# Patient Record
Sex: Female | Born: 1955 | Hispanic: Yes | Marital: Married | Smoking: Never smoker
Health system: Southern US, Community
[De-identification: ages and names within clinical notes are randomized; demographics above are authoritative.]

## PROBLEM LIST (undated history)

## (undated) DIAGNOSIS — E785 Hyperlipidemia, unspecified: Secondary | ICD-10-CM

## (undated) DIAGNOSIS — I10 Essential (primary) hypertension: Secondary | ICD-10-CM

## (undated) DIAGNOSIS — E079 Disorder of thyroid, unspecified: Secondary | ICD-10-CM

## (undated) DIAGNOSIS — M1990 Unspecified osteoarthritis, unspecified site: Secondary | ICD-10-CM

## (undated) DIAGNOSIS — E039 Hypothyroidism, unspecified: Secondary | ICD-10-CM

## (undated) HISTORY — DX: Essential (primary) hypertension: I10

## (undated) HISTORY — DX: Hypothyroidism, unspecified: E03.9

## (undated) HISTORY — DX: Disorder of thyroid, unspecified: E07.9

## (undated) HISTORY — PX: CHOLECYSTECTOMY: SHX55

## (undated) HISTORY — PX: UTERINE FIBROID SURGERY: SHX826

## (undated) HISTORY — DX: Unspecified osteoarthritis, unspecified site: M19.90

---

## 2021-01-25 ENCOUNTER — Emergency Department (HOSPITAL_COMMUNITY): Payer: 59

## 2021-01-25 ENCOUNTER — Other Ambulatory Visit: Payer: Self-pay

## 2021-01-25 ENCOUNTER — Observation Stay (HOSPITAL_COMMUNITY)
Admission: EM | Admit: 2021-01-25 | Discharge: 2021-01-27 | Disposition: A | Discharge status: Home / Self Care | Payer: 59 | Attending: Internal Medicine | Admitting: Internal Medicine

## 2021-01-25 ENCOUNTER — Encounter (HOSPITAL_COMMUNITY): Payer: Self-pay

## 2021-01-25 DIAGNOSIS — Z20822 Contact with and (suspected) exposure to covid-19: Secondary | ICD-10-CM | POA: Insufficient documentation

## 2021-01-25 DIAGNOSIS — R079 Chest pain, unspecified: Secondary | ICD-10-CM

## 2021-01-25 DIAGNOSIS — E039 Hypothyroidism, unspecified: Secondary | ICD-10-CM | POA: Insufficient documentation

## 2021-01-25 DIAGNOSIS — R0789 Other chest pain: Principal | ICD-10-CM | POA: Insufficient documentation

## 2021-01-25 DIAGNOSIS — Z79899 Other long term (current) drug therapy: Secondary | ICD-10-CM | POA: Diagnosis not present

## 2021-01-25 DIAGNOSIS — R55 Syncope and collapse: Secondary | ICD-10-CM | POA: Diagnosis not present

## 2021-01-25 HISTORY — DX: Hyperlipidemia, unspecified: E78.5

## 2021-01-25 HISTORY — DX: Chest pain, unspecified: R07.9

## 2021-01-25 LAB — CBC
HCT: 46.7 % — ABNORMAL HIGH (ref 36.0–46.0)
Hemoglobin: 15.6 g/dL — ABNORMAL HIGH (ref 12.0–15.0)
MCH: 27.6 pg (ref 26.0–34.0)
MCHC: 33.4 g/dL (ref 30.0–36.0)
MCV: 82.7 fL (ref 80.0–100.0)
Platelets: 307 10*3/uL (ref 150–400)
RBC: 5.65 MIL/uL — ABNORMAL HIGH (ref 3.87–5.11)
RDW: 12.8 % (ref 11.5–15.5)
WBC: 17.2 10*3/uL — ABNORMAL HIGH (ref 4.0–10.5)
nRBC: 0 % (ref 0.0–0.2)

## 2021-01-25 LAB — TROPONIN I (HIGH SENSITIVITY)
Troponin I (High Sensitivity): 3 ng/L (ref <18)
Troponin I (High Sensitivity): 3 ng/L (ref <18)

## 2021-01-25 LAB — RESP PANEL BY RT-PCR (FLU A&B, COVID) ARPGX2
Influenza A by PCR: NEGATIVE
Influenza B by PCR: NEGATIVE
SARS Coronavirus 2 by RT PCR: NEGATIVE

## 2021-01-25 LAB — BASIC METABOLIC PANEL
Anion gap: 13 (ref 5–15)
BUN: 25 mg/dL — ABNORMAL HIGH (ref 8–23)
CO2: 25 mmol/L (ref 22–32)
Calcium: 9.6 mg/dL (ref 8.9–10.3)
Chloride: 92 mmol/L — ABNORMAL LOW (ref 98–111)
Creatinine, Ser: 0.65 mg/dL (ref 0.44–1.00)
GFR, Estimated: >60 mL/min (ref >60)
Glucose, Bld: 84 mg/dL (ref 70–99)
Potassium: 4.3 mmol/L (ref 3.5–5.1)
Sodium: 130 mmol/L — ABNORMAL LOW (ref 135–145)

## 2021-01-25 MED ORDER — ENOXAPARIN SODIUM 40 MG/0.4ML IJ SOSY: 40.0000 mg | PREFILLED_SYRINGE | INTRAMUSCULAR | Status: DC

## 2021-01-25 MED ORDER — METHYLPREDNISOLONE 4 MG PO TBPK: 4.0000 mg | ORAL_TABLET | ORAL | Status: DC

## 2021-01-25 MED ORDER — PRAVASTATIN SODIUM 20 MG PO TABS
10.0000 mg | ORAL_TABLET | Freq: Every day | ORAL | Status: DC
  Administered 2021-01-26: 10 mg via ORAL
  Filled 2021-01-25: qty 1

## 2021-01-25 MED ORDER — METHYLPREDNISOLONE 2 MG PO TABS: 4.0000 mg | ORAL_TABLET | Freq: Every day | ORAL | Status: DC

## 2021-01-25 MED ORDER — ENOXAPARIN SODIUM 40 MG/0.4ML IJ SOSY
40.0000 mg | PREFILLED_SYRINGE | INTRAMUSCULAR | Status: DC
  Administered 2021-01-26 (×2): 40 mg via SUBCUTANEOUS
  Filled 2021-01-25 (×2): qty 0.4

## 2021-01-25 MED ORDER — METHYLPREDNISOLONE 2 MG PO TABS
4.0000 mg | ORAL_TABLET | Freq: Two times a day (BID) | ORAL | Status: DC
  Filled 2021-01-25: qty 2

## 2021-01-25 MED ORDER — ASPIRIN 81 MG PO CHEW
324.0000 mg | CHEWABLE_TABLET | Freq: Once | ORAL | Status: AC
  Administered 2021-01-25: 324 mg via ORAL
  Filled 2021-01-25: qty 4

## 2021-01-25 MED ORDER — LEVOTHYROXINE SODIUM 100 MCG PO TABS
100.0000 ug | ORAL_TABLET | Freq: Every morning | ORAL | Status: DC
  Administered 2021-01-26 – 2021-01-27 (×2): 100 ug via ORAL
  Filled 2021-01-25 (×2): qty 1

## 2021-01-25 MED ORDER — SODIUM CHLORIDE 0.9 % IV BOLUS
1000.0000 mL | Freq: Once | INTRAVENOUS | Status: AC
  Administered 2021-01-25: 1000 mL via INTRAVENOUS

## 2021-01-25 MED ORDER — ACETAMINOPHEN 325 MG PO TABS: 650.0000 mg | ORAL_TABLET | ORAL | Status: DC | PRN

## 2021-01-25 MED ORDER — IOHEXOL 350 MG/ML SOLN
100.0000 mL | Freq: Once | INTRAVENOUS | Status: AC | PRN
  Administered 2021-01-25: 100 mL via INTRAVENOUS

## 2021-01-25 MED ORDER — METHYLPREDNISOLONE 4 MG PO TBPK: ORAL_TABLET | Freq: Two times a day (BID) | ORAL | Status: AC

## 2021-01-25 MED ORDER — ONDANSETRON HCL 4 MG/2ML IJ SOLN: 4.0000 mg | Freq: Four times a day (QID) | INTRAMUSCULAR | Status: DC | PRN

## 2021-01-25 MED ORDER — CYCLOBENZAPRINE HCL 5 MG PO TABS: 5.0000 mg | ORAL_TABLET | Freq: Every day | ORAL | Status: DC | PRN

## 2021-01-25 MED ORDER — LOSARTAN POTASSIUM 25 MG PO TABS
25.0000 mg | ORAL_TABLET | Freq: Two times a day (BID) | ORAL | Status: DC
  Administered 2021-01-26 – 2021-01-27 (×2): 25 mg via ORAL
  Filled 2021-01-25 (×4): qty 1

## 2021-01-25 MED ORDER — METHYLPREDNISOLONE 4 MG PO TBPK: ORAL_TABLET | Freq: Every day | ORAL | Status: AC

## 2021-01-25 NOTE — ED Provider Notes (Signed)
Emergency Medicine Provider Triage Evaluation Note  Beverly Trevino , a 65 y.o. female  was evaluated in triage.  Pt complains of chest pain.  Patient reports left-sided chest pain radiating into her arm started last yesterday morning, pain has been intermittent, but became more severe this morning.  At 4 AM when she got up to go to the bathroom she had a syncopal episode and fell to the ground, is unsure if she hit her head but does complain of some headache.  Review of Systems  Positive: Chest pain, syncope Negative: Fever  Physical Exam  BP (!) 155/76 (BP Location: Left Arm)   Pulse 77   Temp 97.9 F (36.6 C) (Oral)   Resp 18   Ht 4\' 11"  (1.499 m)   Wt 61.7 kg   SpO2 100%   BMI 27.47 kg/m  Gen:   Awake, no distress, but is somewhat ill-appearing Resp:  Normal effort, heart RRR MSK:   Moves extremities without difficulty  Other:    Medical Decision Making  Medically screening exam initiated at 10:58 AM.  Appropriate orders placed.  David Towson was informed that the remainder of the evaluation will be completed by another provider, this initial triage assessment does not replace that evaluation, and the importance of remaining in the ED until their evaluation is complete.  Chest pain and syncope, will need acute bed for more emergent evaluation.  Notified triage nurse.   Jacqlyn Larsen, PA-C 01/25/21 1109    Fredia Sorrow, MD 01/31/21 [phone]

## 2021-01-25 NOTE — ED Triage Notes (Signed)
Patient reports that she began having chest pain yesterday and at 0400 she began having left arm pain as well and states the pain radiates into the back. Patient also reports that she passed out for a brief second, but did not hit her head.

## 2021-01-25 NOTE — ED Notes (Signed)
Blue top tube sent to lab. 

## 2021-01-25 NOTE — ED Provider Notes (Signed)
[facility] DEPT Provider Note   CSN: [id number] Arrival date & time: 01/25/21  3546     History Chief Complaint  Patient presents with   Chest Pain   Dizziness    Beverly Trevino is a 65 y.o. female with a past medical history significant for thyroid disease who presents to the ED due to intermittent, central chest pain that started yesterday.  Patient states chest pain radiates in between shoulder blades and down left arm.  She describes pain as a pressure-like sensation. Patient denies associated shortness of breath, nausea, vomiting, diaphoresis. Denies dizziness. Patient notes early this morning she woke up to use the bathroom when she lost consciousness.  Unsure whether or not she hit her head.  No history of seizures.  No urinary incontinence or mouth trauma.  She admits to generalized weakness. Denies history of MI or CVA.  Denies tobacco use.  Denies history of blood clots, recent surgeries, recent long immobilizations, and hormonal treatments.  No lower extremity edema.  Last episode of chest pain was 8 AM this morning.  Chest pain is nonexertional or pleuritic. No recent illness.   History obtained from patient and past medical records. Official Patent attorney used during entire encounter.      Past Medical History:  Diagnosis Date   Thyroid disease     There are no problems to display for this patient.   Past Surgical History:  Procedure Laterality Date   CHOLECYSTECTOMY       OB History   No obstetric history on file.     History reviewed. No pertinent family history.  Social History   Tobacco Use   Smoking status: Never   Smokeless tobacco: Never  Vaping Use   Vaping Use: Never used  Substance Use Topics   Alcohol use: Not Currently   Drug use: Never    Home Medications Prior to Admission medications   Not on File    Allergies    Lactose intolerance (gi) and Other  Review of Systems   Review  of Systems  Constitutional:  Negative for chills and fever.  Respiratory:  Negative for shortness of breath.   Cardiovascular:  Positive for chest pain.  Gastrointestinal:  Negative for abdominal pain, nausea and vomiting.  Musculoskeletal:  Positive for back pain.  Neurological:  Positive for weakness (generalized).  All other systems reviewed and are negative.  Physical Exam Updated Vital Signs BP 129/65   Pulse 73   Temp 97.9 F (36.6 C) (Oral)   Resp 16   Ht 4\' 11"  (1.499 m)   Wt 61.7 kg   SpO2 99%   BMI 27.47 kg/m   Physical Exam Vitals and nursing note reviewed.  Constitutional:      General: She is not in acute distress.    Appearance: She is not ill-appearing.  HENT:     Head: Normocephalic.  Eyes:     Pupils: Pupils are equal, round, and reactive to light.  Cardiovascular:     Rate and Rhythm: Normal rate and regular rhythm.     Pulses: Normal pulses.     Heart sounds: Normal heart sounds. No murmur heard.   No friction rub. No gallop.  Pulmonary:     Effort: Pulmonary effort is normal.     Breath sounds: Normal breath sounds.  Abdominal:     General: Abdomen is flat. There is no distension.     Palpations: Abdomen is soft.     Tenderness: There  is no abdominal tenderness. There is no guarding or rebound.  Musculoskeletal:        General: Normal range of motion.     Cervical back: Neck supple.  Skin:    General: Skin is warm and dry.  Neurological:     General: No focal deficit present.     Mental Status: She is alert.     Comments: Speech is clear, able to follow commands CN III-XII intact Normal strength in upper and lower extremities bilaterally including dorsiflexion and plantar flexion, strong and equal grip strength Sensation grossly intact throughout Moves extremities without ataxia, coordination intact No pronator drift   Psychiatric:        Mood and Affect: Mood normal.        Behavior: Behavior normal.    ED Results / Procedures /  Treatments   Labs (all labs ordered are listed, but only abnormal results are displayed) Labs Reviewed  BASIC METABOLIC PANEL - Abnormal; Notable for the following components:      Result Value   Sodium 130 (*)    Chloride 92 (*)    BUN 25 (*)    All other components within normal limits  CBC - Abnormal; Notable for the following components:   WBC 17.2 (*)    RBC 5.65 (*)    Hemoglobin 15.6 (*)    HCT 46.7 (*)    All other components within normal limits  RESP PANEL BY RT-PCR (FLU A&B, COVID) [id number]  TROPONIN I (HIGH SENSITIVITY)  TROPONIN I (HIGH SENSITIVITY)    EKG EKG Interpretation  Date/Time:  Thursday January 25 2021 10:19:14 EDT Ventricular Rate:  91 PR Interval:  115 QRS Duration: 92 QT Interval:  353 QTC Calculation: 435 R Axis:   49 Text Interpretation: Sinus rhythm Borderline short PR interval normal, no old comparison Confirmed by Charlesetta Shanks [phone]) on 01/25/2021 4:06:27 PM  Radiology DG Chest 2 View  Result Date: 01/25/2021 CLINICAL DATA:  Chest pain LEFT-sided chest pain radiating into arm that started yesterday morning. EXAM: CHEST - 2 VIEW COMPARISON:  None FINDINGS: EKG leads projecting over the chest. Trachea up is midline. Cardiomediastinal contours and hilar structures are normal. Lungs are clear. No effusion. On limited assessment no acute skeletal process. IMPRESSION: No acute cardiopulmonary disease. Electronically Signed   By: Zetta Bills M.D.   On: 01/25/2021 11:43   CT Head Wo Contrast  Result Date: 01/25/2021 CLINICAL DATA:  Head trauma, minor head trauma with mental status changes in a 65 year old female. EXAM: CT HEAD WITHOUT CONTRAST TECHNIQUE: Contiguous axial images were obtained from the base of the skull through the vertex without intravenous contrast. COMPARISON:  None. FINDINGS: Brain: No evidence of acute infarction, hemorrhage, hydrocephalus, extra-axial collection or mass lesion/mass effect. Vascular: No hyperdense vessel or unexpected  calcification. Skull: Normal. Negative for fracture or focal lesion. Sinuses/Orbits: Visualized paranasal sinuses and orbits are unremarkable. Other: None IMPRESSION: No acute intracranial abnormality. Electronically Signed   By: Zetta Bills M.D.   On: 01/25/2021 11:52   CT Angio Chest/Abd/Pel for Dissection W and/or W/WO  Result Date: 01/25/2021 CLINICAL DATA:  Chest and abdominal pain radiating to back EXAM: CT ANGIOGRAPHY CHEST, ABDOMEN AND PELVIS TECHNIQUE: Non-contrast CT of the chest was initially obtained. Multidetector CT imaging through the chest, abdomen and pelvis was performed using the standard protocol during bolus administration of intravenous contrast. Multiplanar reconstructed images and MIPs were obtained and reviewed to evaluate the vascular anatomy. CONTRAST:  150mL OMNIPAQUE IOHEXOL 350 MG/ML SOLN  COMPARISON:  Chest radiograph January 25, 2021 FINDINGS: CTA CHEST FINDINGS Cardiovascular: There is no appreciable intramural hematoma on noncontrast enhanced study. There is no evident mediastinal hematoma. There is no appreciable thoracic aortic aneurysm or dissection. There is aortic atherosclerosis. No plaque ulceration evident. There is slight calcification at the origins of the right innominate and left subclavian artery. Visualized great vessels otherwise appear normal. No aneurysm or dissection involving visualized great vessels. There is no appreciable pulmonary embolus. No pericardial effusion or pericardial thickening evident. Mediastinum/Nodes: No thyroid lesions evident. No evident thoracic adenopathy. No esophageal lesions. Lungs/Pleura: There is minimal bibasilar atelectasis. Lungs otherwise are clear. No pleural effusions. No pneumothorax. Trachea and major bronchial structures appear patent. Musculoskeletal: No evident fracture or dislocation. No blastic or lytic bone lesions. No chest wall lesions. Review of the MIP images confirms the above findings. CTA ABDOMEN AND PELVIS  FINDINGS VASCULAR Aorta: There is no abdominal aortic aneurysm or dissection. There are foci atherosclerotic calcification distal abdominal aorta without hemodynamically significant obstruction. Celiac: No aneurysm or dissection involving the celiac artery or its branches. There is an acute turn near the origin of the celiac artery with less than 50% diameter narrowing in this area. Other portions of the celiac artery as well as the celiac artery branches otherwise widely patent throughout their courses. SMA: No aneurysm or dissection involving the superior mesenteric artery or its branches. Celiac artery and its branches are widely patent. Renals: There is a single renal artery on each side. The renal arteries and their respective branches are widely patent. There is minimal calcification at origin of the left main renal artery. No aneurysm or dissection involving these vessels. No evident fibromuscular dysplasia on either side. IMA: Inferior mesenteric artery and its branches are widely patent. No aneurysm or dissection involving these vessels. Inflow: There is mild calcification in the proximal and mid common iliac arteries without hemodynamically significant obstruction. There is also mild calcification in distal aspect of the common iliac arteries without hemodynamically significant obstruction. Other major pelvic arterial vessels are widely patent bilaterally. There is minimal calcification in the distal right common femoral artery. Proximal profunda femoral and superficial femoral arteries are patent. No aneurysm or dissection involving pelvic arterial vessels noted. Veins: No obvious venous abnormality within the limitations of this arterial phase study. Review of the MIP images confirms the above findings. NON-VASCULAR Hepatobiliary: There is hepatic steatosis. No focal liver lesions are appreciable. Gallbladder is absent. There is no biliary duct dilatation. Pancreas: No pancreatic mass or inflammatory  focus. Spleen: No splenic lesions are evident. Adrenals/Urinary Tract: Adrenals bilaterally appear normal. There is scarring in the upper pole of the right kidney. There is no renal mass on either side. There is symmetric fullness of each renal collecting system without renal or ureteral calculus on either side. The urinary bladder is distended without urinary bladder wall thickening. Stomach/Bowel: There is no appreciable bowel wall or mesenteric thickening. No evident bowel obstruction. There are scattered colonic diverticula on the right without diverticulitis. Terminal ileum appears normal. There is fatty infiltration of the ileocecal valve. There is no evident appendiceal/periappendiceal region inflammation. Lymphatic: There is no evident adenopathy in the abdomen or pelvis. Reproductive: Uterus absent.  No adnexal masses are evident. Other: There is no evident abscess or ascites in the abdomen or pelvis. Musculoskeletal: No fracture or dislocation. There are foci degenerative change in the lumbar spine. There are no blastic or lytic bone lesions. 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 No intramuscular lesions are evident. Review of the MIP images confirms the above findings. IMPRESSION: CT angiogram chest: 1. No thoracic aortic aneurysm or dissection. There are foci great vessel and aortic atherosclerosis. No ulceration or hemodynamically significant obstruction in the thoracic aorta or visualized great vessels. 2.  No evident pulmonary embolus. 3. Slight bibasilar atelectasis. No edema or airspace opacity. No pleural effusions. 4.  No evident thoracic adenopathy. CT angiogram abdomen; CT angiogram pelvis: 1. No aneurysm or dissection involving abdominal aorta, major mesenteric, and major pelvic arterial vessels. No fibromuscular dysplasia. 2. Scattered areas of hemodynamically significant obstruction, most notably in the  distal aorta as well as in portions of each common iliac artery. No plaque ulcerations. 3. Urinary bladder is distended diffusely. No bladder wall thickening. Fullness of each renal collecting system is likely due to stasis from the urinary bladder fullness. No renal or ureteral calculus evident on either side. 4. Right-sided colonic diverticula without diverticulitis. No bowel wall thickening or bowel obstruction. No abscess in the abdomen or pelvis. No periappendiceal region inflammation. 5.  Hepatic steatosis.  Gallbladder absent. Aortic Atherosclerosis (ICD10-I70.0). Electronically Signed   By: Lowella Grip III M.D.   On: 01/25/2021 13:45    Procedures Procedures   Medications Ordered in ED Medications  aspirin chewable tablet 324 mg (324 mg Oral Given 01/25/21 1246)  iohexol (OMNIPAQUE) 350 MG/ML injection 100 mL (100 mLs Intravenous Contrast Given 01/25/21 1256)  sodium chloride 0.9 % bolus 1,000 mL (1,000 mLs Intravenous New Bag/Given 01/25/21 1442)    ED Course  I have reviewed the triage vital signs and the nursing notes.  Pertinent labs & imaging results that were available during my care of the patient were reviewed by me and considered in my medical decision making (see chart for details).  Clinical Course as of 01/25/21 1616  Thu Jan 25, 2021  1222 Sodium(!): 130 [CA]  1222 WBC(!): 17.2 [CA]  1222 Hemoglobin(!): 15.6 [CA]    Clinical Course User Index [CA] Suzy Bouchard, PA-C   MDM Rules/Calculators/A&P                         65 year old female presents to the ED due to chest pain that radiates to back and down left arm that started yesterday.  No cardiac history.  Patient had a syncopal episode earlier this morning.  Upon arrival, stable vitals.  Patient nontoxic-appearing.  Physical exam reassuring.  Cardiac labs, CT head, chest x-ray, and EKG ordered at triage.  Dissection study ordered to rule out dissection. ASA given. Discussed case with Dr. Rogene Houston who agrees  with assessment and plan.  Delta troponin flat. Low suspicion for ACS. CBC significant for leukocytosis at 17.2 and elevated hemoglobin at 15.6 likely due to hemoconcentration.  BMP significant for hyponatremia 130 BUN at 25.  IV fluids given.  CT head personally reviewed which is negative for any acute abnormalities.  Chest x-ray personally reviewed which is negative for signs of pneumonia, pneumothorax or widened mediastinum. CT dissection study personally reviewed which is negative for any acute abnormalities. No dissection. EKG reviewed which demonstrates NSR with no signs of acute ischemia. Normal orthostatic vitals.   4:02 PM Discussed case with patient's PCP who is also patient's son in law who believes patient will benefit from further evaluation in the hospital given unknown etiology of syncopal episode.  Discussed case with Dr. Roosevelt Locks with [facility] who agrees to  admit patient for further treatment. Final Clinical Impression(s) / ED Diagnoses Final diagnoses:  Syncope, unspecified syncope type    Rx / DC Orders ED Discharge Orders     None        Karie Kirks 01/25/21 1617    Fredia Sorrow, MD 01/31/21 1620

## 2021-01-25 NOTE — H&P (Signed)
History and Physical    Beverly Trevino ZRA:[id number] DOB: [birth year] DOA: 01/25/2021  PCP: Pcp, No (Confirm with patient/family/NH records and if not entered, this has to be entered at [facility] point of entry) Patient coming from: [facility]  I have personally briefly reviewed patient's old medical records in [place]  Chief Complaint: Chest pain and Syncope  HPI: Beverly Trevino is a 65 y.o. female with medical history significant of recently diagnosed HTN, HLD, chronic neck pain, just started losartan 2 days ago, hypothyroidism, anxiety/depression, presented with new onset of chest pain and syncope.  Patient reported that yesterday morning she woke up with severe pressure-like chest pain 9/10, radiating to the back and left shoulder and upper arm, lasted for about 2 hours, she took some OTC Tylenol with partial relief.  Associated symptoms were shortness of breath and nauseous.  2 days ago, patient was started on losartan after diagnosed with HTN.  Patient also reported that 2 weeks ago she had a similar episode of chest pains also radiated to the left shoulder and upper arm.  Resolved by its own that time.  Patient has a flareup of her neck pain 7 days ago was treated with Medrol pack.  This morning, patient woke up to go to bathroom then felt lightheaded then collapsed in the hallway, denied any loss of consciousness or hit her head.  No chest pain today. ED Course: Troponin negative x2.  EKG showed no acute ST-T changes.  CTA negative for PE or dissection.  Review of Systems: As per HPI otherwise 14 point review of systems negative.    Past Medical History:  Diagnosis Date   Thyroid disease     Past Surgical History:  Procedure Laterality Date   CHOLECYSTECTOMY       reports that she has never smoked. She has never used smokeless tobacco. She reports previous alcohol use. She reports that she does not use drugs.  Allergies  Allergen Reactions   Lactose Intolerance  (Gi)    Other     "All meat"    History reviewed. No pertinent family history.   Prior to Admission medications   Medication Sig Start Date End Date Taking? Authorizing Provider  celecoxib (CELEBREX) 200 MG capsule Take 200 mg by mouth daily. 01/20/21  Yes [provider]  cyclobenzaprine (FLEXERIL) 5 MG tablet Take 5 mg by mouth daily as needed for muscle spasms. 01/20/21  Yes [provider]  levothyroxine (SYNTHROID) 100 MCG tablet Take 100 mcg by mouth every morning. 01/12/21  Yes [provider]  losartan (COZAAR) 50 MG tablet Take 25 mg by mouth 2 (two) times daily. 01/20/21  Yes [provider]  lovastatin (MEVACOR) 10 MG tablet Take 10 mg by mouth daily. 01/23/21  Yes [provider]  methylPREDNISolone (MEDROL DOSEPAK) 4 MG TBPK tablet Take 4 mg by mouth as directed. 21 tablet dose pack 01/20/21  Yes [provider]    Physical Exam: Vitals:   01/25/21 1530 01/25/21 1545 01/25/21 1600 01/25/21 1714  BP: 125/63  129/65 (!) 112/54  Pulse: 71 71 73 75  Resp: 13 16 16 17   Temp:      TempSrc:      SpO2: 100% 99% 99% 99%  Weight:      Height:        Constitutional: NAD, calm, comfortable Vitals:   01/25/21 1530 01/25/21 1545 01/25/21 1600 01/25/21 1714  BP: 125/63  129/65 (!) 112/54  Pulse: 71 71 73 75  Resp: 13 16 16 17   Temp:      TempSrc:      SpO2: 100% 99% 99% 99%  Weight:      Height:       Eyes: PERRL, lids and conjunctivae normal ENMT: Mucous membranes are moist. Posterior pharynx clear of any exudate or lesions.Normal dentition.  Neck: normal, supple, no masses, no thyromegaly Respiratory: clear to auscultation bilaterally, no wheezing, no crackles. Normal respiratory effort. No accessory muscle use.  Cardiovascular: Regular rate and rhythm, no murmurs / rubs / gallops. No extremity edema. 2+ pedal pulses. No carotid bruits.  Abdomen: no tenderness, no masses palpated. No hepatosplenomegaly. Bowel sounds positive.   Musculoskeletal: no clubbing / cyanosis. No joint deformity upper and lower extremities. Good ROM, no contractures. Normal muscle tone.  Skin: no rashes, lesions, ulcers. No induration Neurologic: CN 2-12 grossly intact. Sensation intact, DTR normal. Strength 5/5 in all 4.  Psychiatric: Normal judgment and insight. Alert and oriented x 3. Normal mood.     Labs on Admission: I have personally reviewed following labs and imaging studies  CBC: Recent Labs  Lab 01/25/21 1048  WBC 17.2*  HGB 15.6*  HCT 46.7*  MCV 82.7  PLT 867   Basic Metabolic Panel: Recent Labs  Lab 01/25/21 1048  NA 130*  K 4.3  CL 92*  CO2 25  GLUCOSE 84  BUN 25*  CREATININE 0.65  CALCIUM 9.6   GFR: Estimated Creatinine Clearance: 56.7 mL/min (by C-G formula based on SCr of 0.65 mg/dL). Liver Function Tests: No results for input(s): AST, ALT, ALKPHOS, BILITOT, PROT, ALBUMIN in the last 168 hours. No results for input(s): LIPASE, AMYLASE in the last 168 hours. No results for input(s): AMMONIA in the last 168 hours. Coagulation Profile: No results for input(s): INR, PROTIME in the last 168 hours. Cardiac Enzymes: No results for input(s): CKTOTAL, CKMB, CKMBINDEX, TROPONINI in the last 168 hours. BNP (last 3 results) No results for input(s): PROBNP in the last 8760 hours. HbA1C: No results for input(s): HGBA1C in the last 72 hours. CBG: No results for input(s): GLUCAP in the last 168 hours. Lipid Profile: No results for input(s): CHOL, HDL, LDLCALC, TRIG, CHOLHDL, LDLDIRECT in the last 72 hours. Thyroid Function Tests: No results for input(s): TSH, T4TOTAL, FREET4, T3FREE, THYROIDAB in the last 72 hours. Anemia Panel: No results for input(s): VITAMINB12, FOLATE, FERRITIN, TIBC, IRON, RETICCTPCT in the last 72 hours. Urine analysis: No results found for: COLORURINE, APPEARANCEUR, LABSPEC, [place], GLUCOSEU, HGBUR, BILIRUBINUR, KETONESUR, PROTEINUR, UROBILINOGEN, NITRITE,  LEUKOCYTESUR  Radiological Exams on Admission: DG Chest 2 View  Result Date: 01/25/2021 CLINICAL DATA:  Chest pain LEFT-sided chest pain radiating into arm that started yesterday morning. EXAM: CHEST - 2 VIEW COMPARISON:  None FINDINGS: EKG leads projecting over the chest. Trachea up is midline. Cardiomediastinal contours and hilar structures are normal. Lungs are clear. No effusion. On limited assessment no acute skeletal process. IMPRESSION: No acute cardiopulmonary disease. Electronically Signed   By: Zetta Bills M.D.   On: 01/25/2021 11:43   CT Head Wo Contrast  Result Date: 01/25/2021 CLINICAL DATA:  Head trauma, minor head trauma with mental status changes in a 65 year old female. EXAM: CT HEAD WITHOUT CONTRAST TECHNIQUE: Contiguous axial images were obtained from the base of the skull through the vertex without intravenous contrast. COMPARISON:  None. FINDINGS: Brain: No evidence of acute infarction, hemorrhage, hydrocephalus, extra-axial collection or mass lesion/mass effect. Vascular: No hyperdense vessel or unexpected calcification. Skull: Normal. Negative for fracture or focal  lesion. Sinuses/Orbits: Visualized paranasal sinuses and orbits are unremarkable. Other: None IMPRESSION: No acute intracranial abnormality. Electronically Signed   By: Zetta Bills M.D.   On: 01/25/2021 11:52   CT Angio Chest/Abd/Pel for Dissection W and/or W/WO  Result Date: 01/25/2021 CLINICAL DATA:  Chest and abdominal pain radiating to back EXAM: CT ANGIOGRAPHY CHEST, ABDOMEN AND PELVIS TECHNIQUE: Non-contrast CT of the chest was initially obtained. Multidetector CT imaging through the chest, abdomen and pelvis was performed using the standard protocol during bolus administration of intravenous contrast. Multiplanar reconstructed images and MIPs were obtained and reviewed to evaluate the vascular anatomy. CONTRAST:  130mL OMNIPAQUE IOHEXOL 350 MG/ML SOLN COMPARISON:  Chest radiograph January 25, 2021 FINDINGS: CTA  CHEST FINDINGS Cardiovascular: There is no appreciable intramural hematoma on noncontrast enhanced study. There is no evident mediastinal hematoma. There is no appreciable thoracic aortic aneurysm or dissection. There is aortic atherosclerosis. No plaque ulceration evident. There is slight calcification at the origins of the right innominate and left subclavian artery. Visualized great vessels otherwise appear normal. No aneurysm or dissection involving visualized great vessels. There is no appreciable pulmonary embolus. No pericardial effusion or pericardial thickening evident. Mediastinum/Nodes: No thyroid lesions evident. No evident thoracic adenopathy. No esophageal lesions. Lungs/Pleura: There is minimal bibasilar atelectasis. Lungs otherwise are clear. No pleural effusions. No pneumothorax. Trachea and major bronchial structures appear patent. Musculoskeletal: No evident fracture or dislocation. No blastic or lytic bone lesions. No chest wall lesions. Review of the MIP images confirms the above findings. CTA ABDOMEN AND PELVIS FINDINGS VASCULAR Aorta: There is no abdominal aortic aneurysm or dissection. There are foci atherosclerotic calcification distal abdominal aorta without hemodynamically significant obstruction. Celiac: No aneurysm or dissection involving the celiac artery or its branches. There is an acute turn near the origin of the celiac artery with less than 50% diameter narrowing in this area. Other portions of the celiac artery as well as the celiac artery branches otherwise widely patent throughout their courses. SMA: No aneurysm or dissection involving the superior mesenteric artery or its branches. Celiac artery and its branches are widely patent. Renals: There is a single renal artery on each side. The renal arteries and their respective branches are widely patent. There is minimal calcification at origin of the left main renal artery. No aneurysm or dissection involving these vessels. No  evident fibromuscular dysplasia on either side. IMA: Inferior mesenteric artery and its branches are widely patent. No aneurysm or dissection involving these vessels. Inflow: There is mild calcification in the proximal and mid common iliac arteries without hemodynamically significant obstruction. There is also mild calcification in distal aspect of the common iliac arteries without hemodynamically significant obstruction. Other major pelvic arterial vessels are widely patent bilaterally. There is minimal calcification in the distal right common femoral artery. Proximal profunda femoral and superficial femoral arteries are patent. No aneurysm or dissection involving pelvic arterial vessels noted. Veins: No obvious venous abnormality within the limitations of this arterial phase study. Review of the MIP images confirms the above findings. NON-VASCULAR Hepatobiliary: There is hepatic steatosis. No focal liver lesions are appreciable. Gallbladder is absent. There is no biliary duct dilatation. Pancreas: No pancreatic mass or inflammatory focus. Spleen: No splenic lesions are evident. Adrenals/Urinary Tract: Adrenals bilaterally appear normal. There is scarring in the upper pole of the right kidney. There is no renal mass on either side. There is symmetric fullness of each renal collecting system without renal or ureteral calculus on either side. The urinary bladder is distended without  urinary bladder wall thickening. Stomach/Bowel: There is no appreciable bowel wall or mesenteric thickening. No evident bowel obstruction. There are scattered colonic diverticula on the right without diverticulitis. Terminal ileum appears normal. There is fatty infiltration of the ileocecal valve. There is no evident appendiceal/periappendiceal region inflammation. Lymphatic: There is no evident adenopathy in the abdomen or pelvis. Reproductive: Uterus absent.  No adnexal masses are evident. Other: There is no evident abscess or ascites  in the abdomen or pelvis. Musculoskeletal: No fracture or dislocation. There are foci degenerative change in the lumbar spine. There are no blastic or lytic bone lesions. 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 No intramuscular lesions are evident. Review of the MIP images confirms the above findings. IMPRESSION: CT angiogram chest: 1. No thoracic aortic aneurysm or dissection. There are foci great vessel and aortic atherosclerosis. No ulceration or hemodynamically significant obstruction in the thoracic aorta or visualized great vessels. 2.  No evident pulmonary embolus. 3. Slight bibasilar atelectasis. No edema or airspace opacity. No pleural effusions. 4.  No evident thoracic adenopathy. CT angiogram abdomen; CT angiogram pelvis: 1. No aneurysm or dissection involving abdominal aorta, major mesenteric, and major pelvic arterial vessels. No fibromuscular dysplasia. 2. Scattered areas of hemodynamically significant obstruction, most notably in the distal aorta as well as in portions of each common iliac artery. No plaque ulcerations. 3. Urinary bladder is distended diffusely. No bladder wall thickening. Fullness of each renal collecting system is likely due to stasis from the urinary bladder fullness. No renal or ureteral calculus evident on either side. 4. Right-sided colonic diverticula without diverticulitis. No bowel wall thickening or bowel obstruction. No abscess in the abdomen or pelvis. No periappendiceal region inflammation. 5.  Hepatic steatosis.  Gallbladder absent. Aortic Atherosclerosis (ICD10-I70.0). Electronically Signed   By: Lowella Grip III M.D.   On: 01/25/2021 13:45    EKG: Independently reviewed.  Sinus, no acute ST changes  Assessment/Plan Active Problems:   Chest pain   Syncope  (please populate well all problems here in Problem List. (For example, if patient is on BP meds at home and you resume  or decide to hold them, it is a problem that needs to be her. Same for CAD, COPD, HLD and so on)   New onset of chest pain -CTA negative for dissection or PE.  Symptoms has features of angina like.  Ordered stress test. -Echocardiogram.  Syncope -Rule out underlying CAD, stress test as above. -We will check orthostatic vital signs, doubt low-dose of losartan will cause significant drop of her BP and this episode happened in the early morning before patient took her BP meds.  Leukocytosis -Likely from steroid use, which is on the last 2 days for her neck pain. -No indication for antibiotics.  Hypothyroidism -Continue home dose of Synthroid  DVT prophylaxis: Lovenox Code Status: Full code Family Communication: Sister and daughter Disposition Plan: Expect less than 2 midnight hospital stay Consults called: None Admission status: Telemetry observation   Lequita Halt MD [facility] Pager [phone]  01/25/2021, 5:22 PM

## 2021-01-26 ENCOUNTER — Observation Stay (HOSPITAL_BASED_OUTPATIENT_CLINIC_OR_DEPARTMENT_OTHER): Payer: 59

## 2021-01-26 ENCOUNTER — Ambulatory Visit (HOSPITAL_COMMUNITY)
Admit: 2021-01-26 | Discharge: 2021-01-26 | Disposition: A | Discharge status: Home / Self Care | Payer: 59 | Attending: Internal Medicine | Admitting: Internal Medicine

## 2021-01-26 DIAGNOSIS — R079 Chest pain, unspecified: Secondary | ICD-10-CM | POA: Diagnosis not present

## 2021-01-26 DIAGNOSIS — R55 Syncope and collapse: Secondary | ICD-10-CM

## 2021-01-26 DIAGNOSIS — D72829 Elevated white blood cell count, unspecified: Secondary | ICD-10-CM

## 2021-01-26 DIAGNOSIS — Z20822 Contact with and (suspected) exposure to covid-19: Secondary | ICD-10-CM | POA: Diagnosis not present

## 2021-01-26 DIAGNOSIS — E871 Hypo-osmolality and hyponatremia: Secondary | ICD-10-CM | POA: Diagnosis not present

## 2021-01-26 DIAGNOSIS — Z79899 Other long term (current) drug therapy: Secondary | ICD-10-CM | POA: Diagnosis not present

## 2021-01-26 DIAGNOSIS — E039 Hypothyroidism, unspecified: Secondary | ICD-10-CM | POA: Diagnosis not present

## 2021-01-26 DIAGNOSIS — R0789 Other chest pain: Secondary | ICD-10-CM | POA: Diagnosis present

## 2021-01-26 LAB — RENAL FUNCTION PANEL
Albumin: 4.5 g/dL (ref 3.5–5.0)
Anion gap: 12 (ref 5–15)
BUN: 25 mg/dL — ABNORMAL HIGH (ref 8–23)
CO2: 25 mmol/L (ref 22–32)
Calcium: 9.3 mg/dL (ref 8.9–10.3)
Chloride: 97 mmol/L — ABNORMAL LOW (ref 98–111)
Creatinine, Ser: 1.02 mg/dL — ABNORMAL HIGH (ref 0.44–1.00)
GFR, Estimated: >60 mL/min (ref >60)
Glucose, Bld: 108 mg/dL — ABNORMAL HIGH (ref 70–99)
Phosphorus: 4.7 mg/dL — ABNORMAL HIGH (ref 2.5–4.6)
Potassium: 4.4 mmol/L (ref 3.5–5.1)
Sodium: 134 mmol/L — ABNORMAL LOW (ref 135–145)

## 2021-01-26 LAB — NM MYOCAR MULTI W/SPECT W/WALL MOTION / EF
Estimated workload: 1 METS
Exercise duration (min): 0 min
Exercise duration (sec): 0 s
MPHR: 156 {beats}/min
Peak HR: 117 {beats}/min
Percent HR: 75 %
RPE: 0
Rest HR: 77 {beats}/min

## 2021-01-26 LAB — CBC WITH DIFFERENTIAL/PLATELET
Abs Immature Granulocytes: 0.3 10*3/uL — ABNORMAL HIGH (ref 0.00–0.07)
Basophils Absolute: 0.1 10*3/uL (ref 0.0–0.1)
Basophils Relative: 1 %
Eosinophils Absolute: 0.1 10*3/uL (ref 0.0–0.5)
Eosinophils Relative: 1 %
HCT: 47.9 % — ABNORMAL HIGH (ref 36.0–46.0)
Hemoglobin: 15.8 g/dL — ABNORMAL HIGH (ref 12.0–15.0)
Immature Granulocytes: 2 %
Lymphocytes Relative: 27 %
Lymphs Abs: 4.5 10*3/uL — ABNORMAL HIGH (ref 0.7–4.0)
MCH: 27.6 pg (ref 26.0–34.0)
MCHC: 33 g/dL (ref 30.0–36.0)
MCV: 83.6 fL (ref 80.0–100.0)
Monocytes Absolute: 1.2 10*3/uL — ABNORMAL HIGH (ref 0.1–1.0)
Monocytes Relative: 7 %
Neutro Abs: 10.8 10*3/uL — ABNORMAL HIGH (ref 1.7–7.7)
Neutrophils Relative %: 62 %
Platelets: 334 10*3/uL (ref 150–400)
RBC: 5.73 MIL/uL — ABNORMAL HIGH (ref 3.87–5.11)
RDW: 13.2 % (ref 11.5–15.5)
WBC: 17 10*3/uL — ABNORMAL HIGH (ref 4.0–10.5)
nRBC: 0 % (ref 0.0–0.2)

## 2021-01-26 LAB — URINALYSIS, ROUTINE W REFLEX MICROSCOPIC
Bilirubin Urine: NEGATIVE
Glucose, UA: NEGATIVE mg/dL
Hgb urine dipstick: NEGATIVE
Ketones, ur: NEGATIVE mg/dL
Leukocytes,Ua: NEGATIVE
Nitrite: NEGATIVE
Protein, ur: NEGATIVE mg/dL
Specific Gravity, Urine: 1.006 (ref 1.005–1.030)
pH: 6 (ref 5.0–8.0)

## 2021-01-26 LAB — SODIUM, URINE, RANDOM: Sodium, Ur: 46 mmol/L

## 2021-01-26 LAB — ECHOCARDIOGRAM COMPLETE
Area-P 1/2: 3.91 cm2
Height: 59 in
S' Lateral: 2.4 cm
Weight: 2176 oz

## 2021-01-26 LAB — OSMOLALITY: Osmolality: 300 mOsm/kg — ABNORMAL HIGH (ref 275–295)

## 2021-01-26 MED ORDER — REGADENOSON 0.4 MG/5ML IV SOLN
INTRAVENOUS | Status: AC
  Administered 2021-01-26: 0.4 mg via INTRAVENOUS
  Filled 2021-01-26: qty 5

## 2021-01-26 MED ORDER — TECHNETIUM TC 99M TETROFOSMIN IV KIT
30.1000 | PACK | Freq: Once | INTRAVENOUS | Status: AC | PRN
  Administered 2021-01-26: 30.1 via INTRAVENOUS

## 2021-01-26 MED ORDER — SODIUM CHLORIDE 0.9 % IV SOLN: INTRAVENOUS | Status: DC

## 2021-01-26 MED ORDER — REGADENOSON 0.4 MG/5ML IV SOLN: 0.4000 mg | Freq: Once | INTRAVENOUS | Status: AC

## 2021-01-26 MED ORDER — TECHNETIUM TC 99M TETROFOSMIN IV KIT
10.3000 | PACK | Freq: Once | INTRAVENOUS | Status: AC | PRN
  Administered 2021-01-26: 10.3 via INTRAVENOUS

## 2021-01-26 NOTE — ED Notes (Signed)
Spoke to nuclear medicine at [facility]. Pt needs to go to [facility] nuc med for scan and return to [facility] to await bed placement. Clarified with attending Marthenia Rolling, MD and relayed [facility] [place] concerns that cardiology had not seen pt. [place] to contact cardiology and attending. This RN to set up transportation.

## 2021-01-26 NOTE — Progress Notes (Signed)
Lexiscan stress portion completed.  Pt did have drop in BP to 70 systolic,  no symptoms until BP back up.  Given 250cc NS.  BP 112 SBP at end.  Initially she had back pain.  Then she said she had chest pressure but stated she had had for months.  This was through interpreter.  Once going for second set of pictures she told spanish speaking tech that this chest pressure was new.   She was feeling better.    Final results to follow.

## 2021-01-26 NOTE — Progress Notes (Signed)
  Echocardiogram 2D Echocardiogram has been performed.  Randa Lynn Joelynn Dust 01/26/2021, 4:46 PM

## 2021-01-26 NOTE — Progress Notes (Signed)
PROGRESS NOTE    Beverly Trevino  MCN:[id number] DOB: October 31, 1955 DOA: 01/25/2021 PCP: Pcp, No  Outpatient Specialists:   Brief Narrative:  As per H&P done on admission:"Beverly Trevino is a 65 y.o. female with medical history significant of recently diagnosed HTN, HLD, chronic neck pain, just started losartan 2 days ago, hypothyroidism, anxiety/depression, presented with new onset of chest pain and syncope.   Patient reported that yesterday morning she woke up with severe pressure-like chest pain 9/10, radiating to the back and left shoulder and upper arm, lasted for about 2 hours, she took some OTC Tylenol with partial relief.  Associated symptoms were shortness of breath and nauseous.  2 days ago, patient was started on losartan after diagnosed with HTN.  Patient also reported that 2 weeks ago she had a similar episode of chest pains also radiated to the left shoulder and upper arm.  Resolved by its own that time.  Patient has a flareup of her neck pain 7 days ago was treated with Medrol pack.   This morning, patient woke up to go to bathroom then felt lightheaded then collapsed in the hallway, denied any loss of consciousness or hit her head.  No chest pain today. ED Course: Troponin negative x2.  EKG showed no acute ST-T changes.  CTA negative for PE or dissection".  01/26/2021: Cardiac enzymes came back negative.  Nuclear stress test came back negative for ischemia.  However, patient became hypotensive during the test.  We will start patient on IV fluids.  We will check orthostasis.  We will continue telemetry monitoring.  We will consult cardiology team as patient will need prolonged cardiac monitoring on discharge.  Echocardiogram revealed grade 1 diastolic dysfunction with normal ejection fraction.  CTA chest, abdomen and pelvis revealed hepatic steatosis and diverticulosis.  Hyponatremia and leukocytosis were noted on presentation.  We will repeat BMP and CBC.  Will work-up  hyponatremia.  Further management will depend on hospital course.  Assessment & Plan:   Active Problems:   Chest pain   Syncope  New onset of chest pain -CTA negative for dissection or PE.   -Troponins came back negative. -Nuclear cardiac stress test came back negative. -Echocardiogram only revealed grade 1 diastolic dysfunction. -Chest pain has resolved. -Patient became significantly hypotensive during the day cardiac stress test necessitating volume resuscitation.   Syncope: -Continue telemetry monitoring. -Cardiology consult as patient may need prolonged cardiac monitoring. -Check orthostasis. -Volume resuscitation. -Further management will depend on hospital course.  Hyponatremia: -Likely secondary to volume depletion. -On further questioning, patient may have diarrhea. -Sodium has gone up to 134 with hydration. -Check urinalysis, urine sodium, urine and serum osmolality.   Leukocytosis -As per prior documentation: "Likely from steroid use, which is on the last 2 days for her neck pain. -No indication for antibiotics". 01/26/2021: Continue to monitor WBC.  No systemic symptoms.   Hypothyroidism -Continue home dose of Synthroid   DVT prophylaxis: Lovenox Code Status: Full code Family Communication:  Disposition Plan: Home eventually   Consultants:  Cardiology team has been consulted  Procedures:  Nuclear cardiac stress test Echocardiogram  Antimicrobials:  None   Subjective: No new complaints. Diarrhea/loose stool.  Objective: Vitals:   01/26/21 0630 01/26/21 0700 01/26/21 1257 01/26/21 1330  BP: 115/64 113/69 112/63 121/71  Pulse: 84 74 88 98  Resp: 19 17 15 15   Temp:   97.7 F (36.5 C) 98.2 F (36.8 C)  TempSrc:   Oral Oral  SpO2: 96% 93% 97% 100%  Weight:      Height:        Intake/Output Summary (Last 24 hours) at 01/26/2021 1639 Last data filed at 01/25/2021 1704 Gross per 24 hour  Intake 1000 ml  Output --  Net 1000 ml   Filed  Weights   01/25/21 1018  Weight: 61.7 kg    Examination:  General exam: Appears calm and comfortable  Respiratory system: Clear to auscultation.  Cardiovascular system: S1 & S2 h Gastrointestinal system: Abdomen is nondistended, soft and nontender. No organomegaly or masses felt. Normal bowel sounds heard. Central nervous system: Awake and alert.  Patient moves all extremities.  Data Reviewed: I have personally reviewed following labs and imaging studies  CBC: Recent Labs  Lab 01/25/21 1048  WBC 17.2*  HGB 15.6*  HCT 46.7*  MCV 82.7  PLT 664   Basic Metabolic Panel: Recent Labs  Lab 01/25/21 1048  NA 130*  K 4.3  CL 92*  CO2 25  GLUCOSE 84  BUN 25*  CREATININE 0.65  CALCIUM 9.6   GFR: Estimated Creatinine Clearance: 56.7 mL/min (by C-G formula based on SCr of 0.65 mg/dL). Liver Function Tests: No results for input(s): AST, ALT, ALKPHOS, BILITOT, PROT, ALBUMIN in the last 168 hours. No results for input(s): LIPASE, AMYLASE in the last 168 hours. No results for input(s): AMMONIA in the last 168 hours. Coagulation Profile: No results for input(s): INR, PROTIME in the last 168 hours. Cardiac Enzymes: No results for input(s): CKTOTAL, CKMB, CKMBINDEX, TROPONINI in the last 168 hours. BNP (last 3 results) No results for input(s): PROBNP in the last 8760 hours. HbA1C: No results for input(s): HGBA1C in the last 72 hours. CBG: No results for input(s): GLUCAP in the last 168 hours. Lipid Profile: No results for input(s): CHOL, HDL, LDLCALC, TRIG, CHOLHDL, LDLDIRECT in the last 72 hours. Thyroid Function Tests: No results for input(s): TSH, T4TOTAL, FREET4, T3FREE, THYROIDAB in the last 72 hours. Anemia Panel: No results for input(s): VITAMINB12, FOLATE, FERRITIN, TIBC, IRON, RETICCTPCT in the last 72 hours. Urine analysis: No results found for: COLORURINE, APPEARANCEUR, LABSPEC, PHURINE, GLUCOSEU, HGBUR, BILIRUBINUR, KETONESUR, PROTEINUR, UROBILINOGEN, NITRITE,  LEUKOCYTESUR Sepsis Labs: @LABRCNTIP (procalcitonin:4,lacticidven:4)  ) Recent Results (from the past 240 hour(s))  Resp Panel by RT-PCR (Flu A&B, Covid) Nasopharyngeal Swab     Status: None   Collection Time: 01/25/21  3:58 PM   Specimen: Nasopharyngeal Swab; Nasopharyngeal(NP) swabs in vial transport medium  Result Value Ref Range Status   SARS Coronavirus 2 by RT PCR NEGATIVE NEGATIVE Final    Comment: (NOTE) SARS-CoV-2 target nucleic acids are NOT DETECTED.  The SARS-CoV-2 RNA is generally detectable in upper respiratory specimens during the acute phase of infection. The lowest concentration of SARS-CoV-2 viral copies this assay can detect is 138 copies/mL. A negative result does not preclude SARS-Cov-2 infection and should not be used as the sole basis for treatment or other patient management decisions. A negative result may occur with  improper specimen collection/handling, submission of specimen other than nasopharyngeal swab, presence of viral mutation(s) within the areas targeted by this assay, and inadequate number of viral copies(<138 copies/mL). A negative result must be combined with clinical observations, patient history, and epidemiological information. The expected result is Negative.  Fact Sheet for Patients:  EntrepreneurPulse.com.au  Fact Sheet for Healthcare Providers:  IncredibleEmployment.be  This test is no t yet approved or cleared by the [place] FDA and  has been authorized for detection and/or diagnosis of SARS-CoV-2 by FDA under an Emergency Use  Authorization (EUA). This EUA will remain  in effect (meaning this test can be used) for the duration of the COVID-19 declaration under Section 564(b)(1) of the Act, 21 U.S.C.section 360bbb-3(b)(1), unless the authorization is terminated  or revoked sooner.       Influenza A by PCR NEGATIVE NEGATIVE Final   Influenza B by PCR NEGATIVE NEGATIVE Final    Comment:  (NOTE) The Xpert Xpress SARS-CoV-2/FLU/RSV plus assay is intended as an aid in the diagnosis of influenza from Nasopharyngeal swab specimens and should not be used as a sole basis for treatment. Nasal washings and aspirates are unacceptable for Xpert Xpress SARS-CoV-2/FLU/RSV testing.  Fact Sheet for Patients: EntrepreneurPulse.com.au  Fact Sheet for Healthcare Providers: IncredibleEmployment.be  This test is not yet approved or cleared by the [place] FDA and has been authorized for detection and/or diagnosis of SARS-CoV-2 by FDA under an Emergency Use Authorization (EUA). This EUA will remain in effect (meaning this test can be used) for the duration of the COVID-19 declaration under Section 564(b)(1) of the Act, 21 U.S.C. section 360bbb-3(b)(1), unless the authorization is terminated or revoked.  Performed at [facility], [place] [address] [place], [place] [address]          Radiology Studies: DG Chest 2 View  Result Date: 01/25/2021 CLINICAL DATA:  Chest pain LEFT-sided chest pain radiating into arm that started yesterday morning. EXAM: CHEST - 2 VIEW COMPARISON:  None FINDINGS: EKG leads projecting over the chest. Trachea up is midline. Cardiomediastinal contours and hilar structures are normal. Lungs are clear. No effusion. On limited assessment no acute skeletal process. IMPRESSION: No acute cardiopulmonary disease. Electronically Signed   By: Zetta Bills M.D.   On: 01/25/2021 11:43   CT Head Wo Contrast  Result Date: 01/25/2021 CLINICAL DATA:  Head trauma, minor head trauma with mental status changes in a 65 year old female. EXAM: CT HEAD WITHOUT CONTRAST TECHNIQUE: Contiguous axial images were obtained from the base of the skull through the vertex without intravenous contrast. COMPARISON:  None. FINDINGS: Brain: No evidence of acute infarction, hemorrhage, hydrocephalus, extra-axial collection or mass lesion/mass  effect. Vascular: No hyperdense vessel or unexpected calcification. Skull: Normal. Negative for fracture or focal lesion. Sinuses/Orbits: Visualized paranasal sinuses and orbits are unremarkable. Other: None IMPRESSION: No acute intracranial abnormality. Electronically Signed   By: Zetta Bills M.D.   On: 01/25/2021 11:52   NM Myocar Multi W/Spect Tamela Oddi Motion / EF  Result Date: 01/26/2021 CLINICAL DATA:  Chest pain. History of hypertension and hyperlipidemia. EXAM: MYOCARDIAL IMAGING WITH SPECT (REST AND PHARMACOLOGIC-STRESS) GATED LEFT VENTRICULAR WALL MOTION STUDY LEFT VENTRICULAR EJECTION FRACTION TECHNIQUE: Standard myocardial SPECT imaging was performed after resting intravenous injection of 10 mCi Tc-25m tetrofosmin. Subsequently, intravenous infusion of Lexiscan was performed under the supervision of the Cardiology staff. At peak effect of the drug, 30 mCi Tc-84m tetrofosmin was injected intravenously and standard myocardial SPECT imaging was performed. Quantitative gated imaging was also performed to evaluate left ventricular wall motion, and estimate left ventricular ejection fraction. COMPARISON:  None. FINDINGS: Raw images: No significant breast or chest wall attenuation. No significant diaphragmatic attenuation. No significant patient motion artifact. Perfusion: No decreased activity in the left ventricle on stress imaging to suggest reversible ischemia or infarction. Wall Motion: Normal left ventricular wall motion. No left ventricular dilation. Left Ventricular Ejection Fraction: 76 % End diastolic volume 30 ml End systolic volume 7 ml IMPRESSION: 1. No scintigraphic evidence of prior infarction or pharmacologically induced ischemia. 2. Normal left ventricular  wall motion. 3. Left ventricular ejection fraction 76% 4. Non invasive risk stratification*: Low *2012 Appropriate Use Criteria for Coronary Revascularization Focused Update: J Am Coll Cardiol. 8453;64(6):803-212.  http://content.[email or web address]?[email or web address] Electronically Signed   By: Sandi Mariscal M.D.   On: 01/26/2021 14:22   CT Angio Chest/Abd/Pel for Dissection W and/or W/WO  Result Date: 01/25/2021 CLINICAL DATA:  Chest and abdominal pain radiating to back EXAM: CT ANGIOGRAPHY CHEST, ABDOMEN AND PELVIS TECHNIQUE: Non-contrast CT of the chest was initially obtained. Multidetector CT imaging through the chest, abdomen and pelvis was performed using the standard protocol during bolus administration of intravenous contrast. Multiplanar reconstructed images and MIPs were obtained and reviewed to evaluate the vascular anatomy. CONTRAST:  145mL OMNIPAQUE IOHEXOL 350 MG/ML SOLN COMPARISON:  Chest radiograph January 25, 2021 FINDINGS: CTA CHEST FINDINGS Cardiovascular: There is no appreciable intramural hematoma on noncontrast enhanced study. There is no evident mediastinal hematoma. There is no appreciable thoracic aortic aneurysm or dissection. There is aortic atherosclerosis. No plaque ulceration evident. There is slight calcification at the origins of the right innominate and left subclavian artery. Visualized great vessels otherwise appear normal. No aneurysm or dissection involving visualized great vessels. There is no appreciable pulmonary embolus. No pericardial effusion or pericardial thickening evident. Mediastinum/Nodes: No thyroid lesions evident. No evident thoracic adenopathy. No esophageal lesions. Lungs/Pleura: There is minimal bibasilar atelectasis. Lungs otherwise are clear. No pleural effusions. No pneumothorax. Trachea and major bronchial structures appear patent. Musculoskeletal: No evident fracture or dislocation. No blastic or lytic bone lesions. No chest wall lesions. Review of the MIP images confirms the above findings. CTA ABDOMEN AND PELVIS FINDINGS VASCULAR Aorta: There is no abdominal aortic aneurysm or dissection. There are foci atherosclerotic calcification distal abdominal aorta  without hemodynamically significant obstruction. Celiac: No aneurysm or dissection involving the celiac artery or its branches. There is an acute turn near the origin of the celiac artery with less than 50% diameter narrowing in this area. Other portions of the celiac artery as well as the celiac artery branches otherwise widely patent throughout their courses. SMA: No aneurysm or dissection involving the superior mesenteric artery or its branches. Celiac artery and its branches are widely patent. Renals: There is a single renal artery on each side. The renal arteries and their respective branches are widely patent. There is minimal calcification at origin of the left main renal artery. No aneurysm or dissection involving these vessels. No evident fibromuscular dysplasia on either side. IMA: Inferior mesenteric artery and its branches are widely patent. No aneurysm or dissection involving these vessels. Inflow: There is mild calcification in the proximal and mid common iliac arteries without hemodynamically significant obstruction. There is also mild calcification in distal aspect of the common iliac arteries without hemodynamically significant obstruction. Other major pelvic arterial vessels are widely patent bilaterally. There is minimal calcification in the distal right common femoral artery. Proximal profunda femoral and superficial femoral arteries are patent. No aneurysm or dissection involving pelvic arterial vessels noted. Veins: No obvious venous abnormality within the limitations of this arterial phase study. Review of the MIP images confirms the above findings. NON-VASCULAR Hepatobiliary: There is hepatic steatosis. No focal liver lesions are appreciable. Gallbladder is absent. There is no biliary duct dilatation. Pancreas: No pancreatic mass or inflammatory focus. Spleen: No splenic lesions are evident. Adrenals/Urinary Tract: Adrenals bilaterally appear normal. There is scarring in the upper pole of the  right kidney. There is no renal mass on either side. There is symmetric fullness of each renal collecting system  without renal or ureteral calculus on either side. The urinary bladder is distended without urinary bladder wall thickening. Stomach/Bowel: There is no appreciable bowel wall or mesenteric thickening. No evident bowel obstruction. There are scattered colonic diverticula on the right without diverticulitis. Terminal ileum appears normal. There is fatty infiltration of the ileocecal valve. There is no evident appendiceal/periappendiceal region inflammation. Lymphatic: There is no evident adenopathy in the abdomen or pelvis. Reproductive: Uterus absent.  No adnexal masses are evident. Other: There is no evident abscess or ascites in the abdomen or pelvis. Musculoskeletal: No fracture or dislocation. There are foci degenerative change in the lumbar spine. There are no blastic or lytic bone lesions. 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 No intramuscular lesions are evident. Review of the MIP images confirms the above findings. IMPRESSION: CT angiogram chest: 1. No thoracic aortic aneurysm or dissection. There are foci great vessel and aortic atherosclerosis. No ulceration or hemodynamically significant obstruction in the thoracic aorta or visualized great vessels. 2.  No evident pulmonary embolus. 3. Slight bibasilar atelectasis. No edema or airspace opacity. No pleural effusions. 4.  No evident thoracic adenopathy. CT angiogram abdomen; CT angiogram pelvis: 1. No aneurysm or dissection involving abdominal aorta, major mesenteric, and major pelvic arterial vessels. No fibromuscular dysplasia. 2. Scattered areas of hemodynamically significant obstruction, most notably in the distal aorta as well as in portions of each common iliac artery. No plaque ulcerations. 3. Urinary bladder is distended diffusely. No bladder wall  thickening. Fullness of each renal collecting system is likely due to stasis from the urinary bladder fullness. No renal or ureteral calculus evident on either side. 4. Right-sided colonic diverticula without diverticulitis. No bowel wall thickening or bowel obstruction. No abscess in the abdomen or pelvis. No periappendiceal region inflammation. 5.  Hepatic steatosis.  Gallbladder absent. Aortic Atherosclerosis (ICD10-I70.0). Electronically Signed   By: Lowella Grip III M.D.   On: 01/25/2021 13:45        Scheduled Meds:  enoxaparin (LOVENOX) injection  40 mg Subcutaneous Q24H   levothyroxine  100 mcg Oral q morning   losartan  25 mg Oral BID   methylPREDNISolone   Oral Daily   pravastatin  10 mg Oral q1800   Continuous Infusions:  sodium chloride       LOS: 0 days    Time spent: 35 minutes    Dana Allan, MD  [facility] Pager #: [phone] 7PM-7AM contact night coverage as above

## 2021-01-27 ENCOUNTER — Encounter (HOSPITAL_COMMUNITY): Payer: Self-pay | Admitting: Internal Medicine

## 2021-01-27 ENCOUNTER — Other Ambulatory Visit: Payer: Self-pay

## 2021-01-27 DIAGNOSIS — I10 Essential (primary) hypertension: Secondary | ICD-10-CM | POA: Diagnosis not present

## 2021-01-27 DIAGNOSIS — R079 Chest pain, unspecified: Secondary | ICD-10-CM | POA: Diagnosis not present

## 2021-01-27 DIAGNOSIS — R55 Syncope and collapse: Secondary | ICD-10-CM | POA: Diagnosis not present

## 2021-01-27 LAB — RENAL FUNCTION PANEL
Albumin: 3.8 g/dL (ref 3.5–5.0)
Anion gap: 11 (ref 5–15)
BUN: 18 mg/dL (ref 8–23)
CO2: 25 mmol/L (ref 22–32)
Calcium: 8.8 mg/dL — ABNORMAL LOW (ref 8.9–10.3)
Chloride: 101 mmol/L (ref 98–111)
Creatinine, Ser: 0.52 mg/dL (ref 0.44–1.00)
GFR, Estimated: >60 mL/min (ref >60)
Glucose, Bld: 98 mg/dL (ref 70–99)
Phosphorus: 3.5 mg/dL (ref 2.5–4.6)
Potassium: 4.2 mmol/L (ref 3.5–5.1)
Sodium: 137 mmol/L (ref 135–145)

## 2021-01-27 LAB — CBC WITH DIFFERENTIAL/PLATELET
Abs Immature Granulocytes: 0.21 10*3/uL — ABNORMAL HIGH (ref 0.00–0.07)
Basophils Absolute: 0.1 10*3/uL (ref 0.0–0.1)
Basophils Relative: 1 %
Eosinophils Absolute: 0.1 10*3/uL (ref 0.0–0.5)
Eosinophils Relative: 1 %
HCT: 43.8 % (ref 36.0–46.0)
Hemoglobin: 14.3 g/dL (ref 12.0–15.0)
Immature Granulocytes: 2 %
Lymphocytes Relative: 28 %
Lymphs Abs: 4 10*3/uL (ref 0.7–4.0)
MCH: 27.7 pg (ref 26.0–34.0)
MCHC: 32.6 g/dL (ref 30.0–36.0)
MCV: 84.7 fL (ref 80.0–100.0)
Monocytes Absolute: 1.1 10*3/uL — ABNORMAL HIGH (ref 0.1–1.0)
Monocytes Relative: 8 %
Neutro Abs: 8.6 10*3/uL — ABNORMAL HIGH (ref 1.7–7.7)
Neutrophils Relative %: 60 %
Platelets: 293 10*3/uL (ref 150–400)
RBC: 5.17 MIL/uL — ABNORMAL HIGH (ref 3.87–5.11)
RDW: 13 % (ref 11.5–15.5)
WBC: 14.1 10*3/uL — ABNORMAL HIGH (ref 4.0–10.5)
nRBC: 0 % (ref 0.0–0.2)

## 2021-01-27 LAB — MAGNESIUM: Magnesium: 2.4 mg/dL (ref 1.7–2.4)

## 2021-01-27 LAB — OSMOLALITY, URINE: Osmolality, Ur: 266 mOsm/kg — ABNORMAL LOW (ref 300–900)

## 2021-01-27 NOTE — Discharge Summary (Signed)
Physician Discharge Summary  Patient ID: Beverly Trevino MRN: [id number] DOB/AGE: 1956/06/05 65 y.o.  Admit date: 01/25/2021 Discharge date: 01/27/2021  Admission Diagnoses:  Discharge Diagnoses:  Active Problems:   Chest pain   Syncope   Discharged Condition: stable  Hospital Course: Patient is a 65 year old female with past medical history significant for recently diagnosed hypertension, hyperlipidemia, chronic neck pain, hypothyroidism, anxiety and depression.  Patient was admitted with chest pain and syncope.  Cardiac enzymes done during the hospital stay came back negative.  Nuclear stress test to rule out ischemia also came back negative.  Cardiology team was consulted to assist with syncope work-up.  Syncope was felt to be secondary to volume depletion and hypotension.  No further work-up was recommended by the cardiology team.  Patient was cleared for discharge by the cardiology team.  Patient will follow with a primary care provider within 1 week of discharge.  Consults: cardiology  Significant Diagnostic Studies:  -Negative cardiac enzymes.   -Negative nuclear cardiac stress test. -Echocardiogram revealed grade 1 diastolic dysfunction.  Normal ejection fraction. -CTA chest, abdomen and pelvis revealed hepatic steatosis and diverticulosis.   Discharge Exam: Blood pressure (!) 123/51, pulse 76, temperature 98.2 F (36.8 C), temperature source Oral, resp. rate 16, height 4\' 11"  (1.499 m), weight 61.7 kg, SpO2 100 %.   Disposition: Discharge disposition: 01-Home or Self Care   Discharge Instructions     Diet - low sodium heart healthy   Complete by: As directed    Increase activity slowly   Complete by: As directed       Allergies as of 01/27/2021       Reactions   Lactose Intolerance (gi)    Other    "All meat"        Medication List     STOP taking these medications    celecoxib 200 MG capsule Commonly known as: CELEBREX   methylPREDNISolone 4  MG Tbpk tablet Commonly known as: MEDROL DOSEPAK       TAKE these medications    cyclobenzaprine 5 MG tablet Commonly known as: FLEXERIL Take 5 mg by mouth daily as needed for muscle spasms.   levothyroxine 100 MCG tablet Commonly known as: SYNTHROID Take 100 mcg by mouth every morning.   losartan 50 MG tablet Commonly known as: COZAAR Take 25 mg by mouth 2 (two) times daily.   lovastatin 10 MG tablet Commonly known as: MEVACOR Take 10 mg by mouth daily.         SignedBonnell Public 01/27/2021, 3:18 PM

## 2021-01-27 NOTE — Progress Notes (Signed)
   01/27/21 6468  Provider Notification  Provider Name/Title Ander Slade  Date Provider Notified 01/27/21  Time Provider Notified [phone]  Notification Type Page  Notification Reason Other (Comment) (chest pain)  Provider response Other (Comment) (awaiting md)  Pt with complaints of chest pain that does not radiate down arm but does go through to back.  12 lead performed.  Awaiting md response at this time.  NO other changes in pt condition at this time. VSS.

## 2021-01-27 NOTE — Consult Note (Signed)
CARDIOLOGY CONSULT NOTE     Primary Care Physician: Pcp, No Referring Physician:  Dr Marthenia Rolling  Admit Date: 01/25/2021  Reason for consultation:  CP  Beverly Trevino is a 65 y.o. female with a h/o HL, HTN, and hypothyroidism who presents with CP. The patient reports having severe chest pain with radiation into her back and shoulders, lasting 2 hours on 01/24/21.  She reports associated SOB and nausea.  She has had similar pain previously.   She was admitted for further evaluation and management. She underwent lexiscan myoview on 01/26/21.  Myoview was negative for ischemia.  Additionally, she had lightheadedness and subsequent collapse while walking down the hall to go to her bathroom on 01/24/21.  She did not have syncope.  No other episodes.  Lab work on admission revealed dehydration. She had hypotensive episode during her myoview study.  Symptoms resolved with IV NS bolus.  Today, she denies symptoms of palpitations, chest pain, shortness of breath,  lower extremity edema,  or neurologic sequela. The patient is tolerating medications without difficulties and is otherwise without complaint today.   Past Medical History:  Diagnosis Date   Hyperlipidemia    Hypertension    Thyroid disease    Past Surgical History:  Procedure Laterality Date   CHOLECYSTECTOMY       enoxaparin (LOVENOX) injection  40 mg Subcutaneous Q24H   levothyroxine  100 mcg Oral q morning   losartan  25 mg Oral BID   pravastatin  10 mg Oral q1800    sodium chloride 75 mL/hr at 01/27/21 0501    Allergies  Allergen Reactions   Lactose Intolerance (Gi)    Other     "All meat"    Social History   Socioeconomic History   Marital status: Married    Spouse name: Not on file   Number of children: Not on file   Years of education: Not on file   Highest education level: Not on file  Occupational History   Not on file  Tobacco Use   Smoking status: Never   Smokeless tobacco: Never  Vaping Use   Vaping  Use: Never used  Substance and Sexual Activity   Alcohol use: Never   Drug use: Never   Sexual activity: Not on file  Other Topics Concern   Not on file  Social History Narrative   Lives in [place] Determinants of Health   Financial Resource Strain: Not on file  Food Insecurity: Not on file  Transportation Needs: Not on file  Physical Activity: Not on file  Stress: Not on file  Social Connections: Not on file  Intimate Partner Violence: Not on file    FH- father had CAD in his 28s  ROS- All systems are reviewed and negative except as per the HPI above  Physical Exam: Telemetry: Vitals:   01/26/21 1330 01/26/21 2010 01/27/21 0431 01/27/21 1234  BP: 121/71 111/62 118/64 (!) 123/51  Pulse: 98 87 67 76  Resp: 15 17  16   Temp: 98.2 F (36.8 C) 98.7 F (37.1 C) 97.6 F (36.4 C) 98.2 F (36.8 C)  TempSrc: Oral Oral Oral Oral  SpO2: 100% 95% 100% 100%  Weight:      Height:        GEN- The patient is well appearing, alert   Head- normocephalic, atraumatic Eyes-  Sclera clear, conjunctiva pink Ears- hearing intact Oropharynx- clear Neck- supple,   Lungs- Clear to ausculation bilaterally, normal work of breathing Heart- Regular rate  and rhythm, no murmurs, rubs or gallops, PMI not laterally displaced GI- soft, NT, ND, + BS Extremities- no clubbing, cyanosis, or edema MS- no significant deformity or atrophy Skin- no rash or lesion Psych- euthymic mood, full affect Neuro- strength and sensation are intact  EKG:  sinus rhythm with short PR, no pre-excitation, no ischemic changes  Labs:   Lab Results  Component Value Date   WBC 14.1 (H) 01/27/2021   HGB 14.3 01/27/2021   HCT 43.8 01/27/2021   MCV 84.7 01/27/2021   PLT 293 01/27/2021    Recent Labs  Lab 01/27/21 0400  NA 137  K 4.2  CL 101  CO2 25  BUN 18  CREATININE 0.52  CALCIUM 8.8*  GLUCOSE 98     Radiology:low risk myoview  Echo:  EF 02%, diastolic dysfunction  ASSESSMENT AND  PLAN:   Atypical chest pain HS trop negative Low risk myoview Symptoms have resolved No further CV workup at this time  2. Weakness, presyncope Clinically appears to be due to dehydration. Hypotension now resolved Adequate po hydration advised No indication for monitoring post discharge,  no arrhythmias here No further workup planned  3. HTN Stable No change required today  4. HL Stable No change required today  OK to discharge Follow-up with primary care Cardiology team to see as needed while here. Please call with questions.    Thompson Grayer, MD 01/27/2021  2:25 PM

## 2021-09-18 ENCOUNTER — Emergency Department (HOSPITAL_COMMUNITY): Payer: 59

## 2021-09-18 ENCOUNTER — Encounter (HOSPITAL_COMMUNITY): Payer: Self-pay

## 2021-09-18 ENCOUNTER — Emergency Department (HOSPITAL_COMMUNITY)
Admission: EM | Admit: 2021-09-18 | Discharge: 2021-09-18 | Disposition: A | Discharge status: Home / Self Care | Payer: 59 | Attending: Emergency Medicine | Admitting: Emergency Medicine

## 2021-09-18 DIAGNOSIS — M25562 Pain in left knee: Secondary | ICD-10-CM | POA: Diagnosis present

## 2021-09-18 NOTE — Discharge Instructions (Addendum)
It was a pleasure taking care of you today!  Your x-ray was negative for fracture or dislocation.  You may take over-the-counter 600 mg ibuprofen every 6 hours or 1000 g Tylenol every 6 hours as needed for pain.  You may apply ice to the affected area for up to 15 minutes at a time.  Ensure to place a barrier between your skin and the ice. Maintain follow up appointment with your primary care doctor on 09/26/21.  Return to the emergency department if you are experiencing increasing/worsening swelling, knee pain, color change, or worsening symptoms.

## 2021-09-18 NOTE — ED Provider Notes (Addendum)
[facility] DEPT Provider Note   CSN: [id number] Arrival date & time: 09/18/21  1049     History  Chief Complaint  Patient presents with   Knee Pain    Beverly Trevino is a 66 y.o. female who presents to the ED complaining of left knee pain/swelling onset 1 week.  Denies recent injury, trauma, fall, twisting.  She notes that her symptoms are worsening with walking and at the end of the day her knee hurts more.  Tried ice and ibuprofen with relief of her symptoms.  Denies color change, wound, fever, chills.  Denies OCP, HRT, malignancy, recent immobilization, recent surgery, DVT/PE. Denies anticoagulant use.   The history is provided by the patient. A language interpreter was used (spanish).      Home Medications Prior to Admission medications   Medication Sig Start Date End Date Taking? Authorizing Provider  cyclobenzaprine (FLEXERIL) 5 MG tablet Take 5 mg by mouth daily as needed for muscle spasms. 01/20/21   [provider]  levothyroxine (SYNTHROID) 100 MCG tablet Take 100 mcg by mouth every morning. 01/12/21   [provider]  losartan (COZAAR) 50 MG tablet Take 25 mg by mouth 2 (two) times daily. 01/20/21   [provider]  lovastatin (MEVACOR) 10 MG tablet Take 10 mg by mouth daily. 01/23/21   [provider]      Allergies    Lactose intolerance (gi) and Other    Review of Systems   Review of Systems  Constitutional:  Negative for chills and fever.  Musculoskeletal:  Positive for arthralgias and joint swelling. Negative for gait problem.  Skin:  Negative for color change, rash and wound.  All other systems reviewed and are negative.  Physical Exam Updated Vital Signs BP (!) 132/59 (BP Location: Left Arm)    Pulse 78    Temp 98.7 F (37.1 C) (Oral)    Resp 16    SpO2 100%  Physical Exam Vitals and nursing note reviewed.  Constitutional:      General: She is not in acute distress.    Appearance:  Normal appearance.  Eyes:     General: No scleral icterus.    Extraocular Movements: Extraocular movements intact.  Cardiovascular:     Rate and Rhythm: Normal rate.  Pulmonary:     Effort: Pulmonary effort is normal. No respiratory distress.  Musculoskeletal:     Cervical back: Neck supple.     Right knee: Normal.     Left knee: No swelling, deformity, erythema or bony tenderness. Normal range of motion. Tenderness present over the medial joint line.     Left lower leg: No swelling, tenderness or bony tenderness. No edema.     Comments: Mild tenderness to palpation to medial aspect of left knee. No obvious deformity, effusion, erythema, or swelling.  Full active range of motion of left knee.  Pain noted with extension of left knee no pain noted with flexion of left knee.  No increased warmth or erythema noted to left knee.  Able to ambulate with mild antalgic gait.  Strength and sensation intact to bilateral lower extremities.  Pedal pulses intact.  Negative Homans' sign.  No calf tenderness.  No unilateral leg swelling appreciated.  No ankle tenderness or lower leg tenderness.    Skin:    General: Skin is warm and dry.     Findings: No bruising, erythema or rash.  Neurological:     Mental Status: She is alert.  Psychiatric:  Behavior: Behavior normal.    ED Results / Procedures / Treatments   Labs (all labs ordered are listed, but only abnormal results are displayed) Labs Reviewed - No data to display  EKG None  Radiology DG Knee Complete 4 Views Left  Result Date: 09/18/2021 CLINICAL DATA:  Left knee pain EXAM: LEFT KNEE - COMPLETE 4+ VIEW COMPARISON:  None. FINDINGS: No evidence of fracture, dislocation, or joint effusion. No evidence of arthropathy or other focal bone abnormality. Soft tissues are unremarkable. IMPRESSION: No acute osseous abnormality identified. Electronically Signed   By: Ofilia Neas M.D.   On: 09/18/2021 14:18    Procedures Procedures     Medications Ordered in ED Medications - No data to display  ED Course/ Medical Decision Making/ A&P Clinical Course as of 09/18/21 1541  Tue Sep 18, 2021  1434 Re-evaluated and noted improvement of symptoms with treatment regimen. Discussed discharge treatment plan. Pt agreeable at this time. Pt appears safe for discharge. [SB]    Clinical Course User Index [SB] Markee Remlinger A, PA-C                           Medical Decision Making Amount and/or Complexity of Data Reviewed Radiology: ordered.   Patient with left knee pain onset 1 week.  Patient symptoms worse with weightbearing and worse throughout the day.  No recent injury, trauma, fall.  Patient does not take anticoagulants.  Vital signs stable.  On exam, patient with mild tenderness to palpation to medial aspect of left knee.  No obvious deformity, effusion, erythema.  Full active range of motion of the knee.  Pain noted with extension of left knee, no pain with flexion of the left knee.  Able to ambulate with mild antalgic gait.  Strength and sensation intact bilateral lower extremities.  Negative Homans' sign.  No calf tenderness.  No unilateral leg swelling appreciated.  Differential diagnosis including fracture, dislocation, sprain, septic arthritis, osteoarthritis, DVT.    Imaging: I ordered imaging studies including left knee x-ray I independently visualized and interpreted imaging which showed no acute fracture or dislocation I agree with the radiologist interpretation  Disposition: Patient presentation suspicious for left knee sprain.  Doubt fracture, dislocation, or osteoarthritis due to negative imaging findings.  Doubt septic arthritis at this time, no increased warmth or erythema on exam.  Patient afebrile without tachycardia or hypoxia.  Doubt DVT at this time, patient asymptomatic without calf tenderness, unilateral leg swelling, Homans' sign negative on exam.  No risk factors for DVT at this time.  After  consideration of the diagnostic results and the patients response to treatment, I feel that the patient would benefit from discharge home with a knee sleeve and primary care follow-up. Supportive care measures and strict return precautions discussed with patient at bedside. Pt acknowledges and verbalizes understanding. Pt appears safe for discharge. Follow up as indicated in discharge paperwork.   This chart was dictated using voice recognition software, Dragon. Despite the best efforts of this provider to proofread and correct errors, errors may still occur which can change documentation meaning.  Final Clinical Impression(s) / ED Diagnoses Final diagnoses:  Acute pain of left knee    Rx / DC Orders ED Discharge Orders     None         Shermaine Rivet A, PA-C 09/18/21 1541    Oliviya Gilkison A, PA-C 09/18/21 [place], Ankit, MD 09/19/21 [phone]

## 2021-09-18 NOTE — ED Triage Notes (Signed)
Pt presents with c/o left knee pain. Pt denies any injury to that knee or any falls that have occurred. Pt reports pain has been present for approx one week. No redness or swelling or warmth noted to the area.

## 2021-09-26 ENCOUNTER — Encounter: Payer: Self-pay | Admitting: Family

## 2021-09-26 ENCOUNTER — Ambulatory Visit (INDEPENDENT_AMBULATORY_CARE_PROVIDER_SITE_OTHER): Payer: 59 | Admitting: Family

## 2021-09-26 ENCOUNTER — Other Ambulatory Visit: Payer: Self-pay

## 2021-09-26 VITALS — BP 111/71 | HR 97 | Temp 98.5°F | Ht 59.0 in | Wt 145.8 lb

## 2021-09-26 DIAGNOSIS — M25562 Pain in left knee: Secondary | ICD-10-CM

## 2021-09-26 DIAGNOSIS — M545 Low back pain, unspecified: Secondary | ICD-10-CM | POA: Diagnosis not present

## 2021-09-26 DIAGNOSIS — I10 Essential (primary) hypertension: Secondary | ICD-10-CM

## 2021-09-26 DIAGNOSIS — Z1211 Encounter for screening for malignant neoplasm of colon: Secondary | ICD-10-CM | POA: Diagnosis not present

## 2021-09-26 DIAGNOSIS — G8929 Other chronic pain: Secondary | ICD-10-CM

## 2021-09-26 DIAGNOSIS — Z1231 Encounter for screening mammogram for malignant neoplasm of breast: Secondary | ICD-10-CM | POA: Diagnosis not present

## 2021-09-26 DIAGNOSIS — E039 Hypothyroidism, unspecified: Secondary | ICD-10-CM | POA: Insufficient documentation

## 2021-09-26 MED ORDER — LOSARTAN POTASSIUM 50 MG PO TABS: 25.0000 mg | ORAL_TABLET | Freq: Two times a day (BID) | ORAL | 0 refills | Status: AC

## 2021-09-26 MED ORDER — LEVOTHYROXINE SODIUM 100 MCG PO TABS: 100.0000 ug | ORAL_TABLET | Freq: Every morning | ORAL | 0 refills | Status: AC

## 2021-09-26 MED ORDER — MELOXICAM 15 MG PO TABS: 15.0000 mg | ORAL_TABLET | Freq: Every day | ORAL | 1 refills | Status: DC

## 2021-09-26 MED ORDER — DICLOFENAC SODIUM 1 % EX GEL: 4.0000 g | Freq: Four times a day (QID) | CUTANEOUS | 3 refills | Status: AC | PRN

## 2021-09-26 NOTE — Assessment & Plan Note (Signed)
Chronic - stable on Losartan bid

## 2021-09-26 NOTE — Assessment & Plan Note (Signed)
Chronic - taking med daily, will obtain labs next visit

## 2021-09-26 NOTE — Progress Notes (Signed)
New Patient Office Visit  Subjective:  Patient ID: Beverly Trevino, female    DOB: [birth year]  Age: 66 y.o. MRN: [id number]  CC:  Chief Complaint  Patient presents with   Establish Care   Knee Pain    Seen in ED 09/18/2021 Xray done with normal results. Muscle relaxer given. Pain has not subsided.   Back Pain    Recurrent for a long time. Pain comes and goes.    HPI Beverly Trevino presents for establishing care.  Pain She reports chronic lumbar pain. There was not an injury that may have caused the pain. The pain started  years ago  and is gradually worsening. The pain is in the low back bilaterally. The pain is described as aching and soreness, is moderate in intensity, occurring intermittently. Symptoms are worse in the: intermittently. Aggravating factors: standing and walking She has tried tylenol & heat with little relief. Reports Ibuprofen upsets her stomach. Knee Pain: Patient presents for follow up on a knee problem involving the  left knee. Onset of the symptoms was several weeks ago. Inciting event: none known. Current symptoms include stiffness and swelling. Pain is aggravated by going up and down stairs, lateral movements, rising after sitting, squatting, and walking.  Patient has had no prior knee problems. Evaluation to date: plain films: normal. Treatment to date: avoidance of offending activity, ice, and OTC analgesics which are not very effective.  Hypertension: Patient is currently maintained on the following medications for blood pressure: Losartan bid Patient reports good compliance with blood pressure medications. Patient denies chest pain, headaches, shortness of breath or swelling. Last 3 blood pressure readings in our office are as follows: BP Readings from Last 3 Encounters:  09/26/21 111/71  09/18/21 (!) 132/59  01/27/21 (!) 123/51  Hypothyroidism: Patient presents today for followup of Hypothyroidism.  Patient reports positive compliance with  daily medication.  Patient denies any of the following symptoms: fatigue, cold intolerance, constipation, weight gain or inability to lose weight, muscle weakness, mental slowing, dry hair and skin. Last TSH and free T4: reports last levels wnl   Past Medical History:  Diagnosis Date   Chest pain 01/25/2021   Hyperlipidemia     Past Surgical History:  Procedure Laterality Date   CHOLECYSTECTOMY      History reviewed. No pertinent family history.  Social History   Socioeconomic History   Marital status: Married    Spouse name: Not on file   Number of children: Not on file   Years of education: Not on file   Highest education level: Not on file  Occupational History   Not on file  Tobacco Use   Smoking status: Never   Smokeless tobacco: Never  Vaping Use   Vaping Use: Never used  Substance and Sexual Activity   Alcohol use: Never   Drug use: Never   Sexual activity: Not on file  Other Topics Concern   Not on file  Social History Narrative   Lives in [place] Determinants of Health   Financial Resource Strain: Not on file  Food Insecurity: Not on file  Transportation Needs: Not on file  Physical Activity: Not on file  Stress: Not on file  Social Connections: Not on file  Intimate Partner Violence: Not on file    Objective:   Today's Vitals: BP 111/71    Pulse 97    Temp 98.5 F (36.9 C) (Temporal)    Ht 4\' 11"  (1.499 m)  Wt 145 lb 12.8 oz (66.1 kg)    SpO2 97%    BMI 29.45 kg/m   Physical Exam Vitals and nursing note reviewed.  Constitutional:      Appearance: Normal appearance.  Cardiovascular:     Rate and Rhythm: Normal rate and regular rhythm.  Pulmonary:     Effort: Pulmonary effort is normal.     Breath sounds: Normal breath sounds.  Musculoskeletal:        General: Normal range of motion.     Left knee: Swelling (mild over anterior aspect) present. No erythema. Normal range of motion.  Skin:    General: Skin is warm and dry.   Neurological:     Mental Status: She is alert.  Psychiatric:        Mood and Affect: Mood normal.        Behavior: Behavior normal.    Assessment & Plan:   Problem List Items Addressed This Visit       Cardiovascular and Mediastinum   Essential hypertension    Chronic - stable on Losartan bid      Relevant Medications   losartan (COZAAR) 50 MG tablet     Endocrine   Acquired hypothyroidism    Chronic - taking med daily, will obtain labs next visit      Relevant Medications   levothyroxine (SYNTHROID) 100 MCG tablet     Other   Acute pain of left knee    seen in ER recently, xrays negative, denies injury, gradual pain starting a month ago, mild swelling. pt has been icing and taking tylenol, can't take Ibuprofen as hurts her stomach. Sending Meloxicam and Diclofenac gel, advised on use & SE, continue with ice, also referring to [facility].      Relevant Medications   meloxicam (MOBIC) 15 MG tablet   diclofenac Sodium (VOLTAREN) 1 % GEL   Other Relevant Orders   Ambulatory referral to Orthopedic Surgery   Chronic bilateral low back pain without sciatica - Primary    told she had a bulge, but nothing they can do. Does not hurt daily, unable to take Ibuprofen d/t stomach issues, starting Meloxicam and advised pt avoid acidic foods and take the Omeprazole OTC daily (currently taking prn).      Relevant Medications   meloxicam (MOBIC) 15 MG tablet   Other Visit Diagnoses     Colon cancer screening       Relevant Orders   Ambulatory referral to Gastroenterology   Breast cancer screening by mammogram       Relevant Orders   MM Digital Screening       Outpatient Encounter Medications as of 09/26/2021  Medication Sig   diclofenac Sodium (VOLTAREN) 1 % GEL Apply 4 g topically 4 (four) times daily as needed. to left knee   meloxicam (MOBIC) 15 MG tablet Take 1 tablet (15 mg total) by mouth daily. for back and knee pain   Omega-3 Fatty Acids (FISH OIL) 1000 MG CAPS Take by  mouth.   [DISCONTINUED] levothyroxine (SYNTHROID) 100 MCG tablet Take 100 mcg by mouth every morning.   [DISCONTINUED] losartan (COZAAR) 50 MG tablet Take 25 mg by mouth 2 (two) times daily.   [DISCONTINUED] montelukast (SINGULAIR) 5 MG chewable tablet Chew 5 mg by mouth daily.   levothyroxine (SYNTHROID) 100 MCG tablet Take 1 tablet (100 mcg total) by mouth every morning.   losartan (COZAAR) 50 MG tablet Take 0.5 tablets (25 mg total) by mouth 2 (two) times daily.   [  DISCONTINUED] cyclobenzaprine (FLEXERIL) 5 MG tablet Take 5 mg by mouth daily as needed for muscle spasms. (Patient not taking: Reported on 09/26/2021)   [DISCONTINUED] lovastatin (MEVACOR) 10 MG tablet Take 10 mg by mouth daily. (Patient not taking: Reported on 09/26/2021)   No facility-administered encounter medications on file as of 09/26/2021.   *Due to language barrier, an interpreter was present during the history-taking and subsequent discussion (and for part of the physical exam) with this patient.   Follow-up: Return in about 4 weeks (around 10/24/2021) for with fasting labs.   Jeanie Sewer, NP

## 2021-09-26 NOTE — Assessment & Plan Note (Signed)
seen in ER recently, xrays negative, denies injury, gradual pain starting a month ago, mild swelling. pt has been icing and taking tylenol, can't take Ibuprofen as hurts her stomach. Sending Meloxicam and Diclofenac gel, advised on use & SE, continue with ice, also referring to [facility].

## 2021-09-26 NOTE — Assessment & Plan Note (Signed)
told she had a bulge, but nothing they can do. Does not hurt daily, unable to take Ibuprofen d/t stomach issues, starting Meloxicam and advised pt avoid acidic foods and take the Omeprazole OTC daily (currently taking prn).

## 2021-09-26 NOTE — Patient Instructions (Signed)
Welcome to [facility]! It was a pleasure meeting you today.  As discussed, I have sent your refills to your pharmacy.  Please schedule a 1 month follow up visit today with fasting labs. See the handout attached regarding your knee pain. I have sent a referral to Orthopedics to have your knee further evaluated, but start the Meloxicam pill daily and the Diclofenac gel (4 times/day) to help with the pain. Continue to ice as needed for 20-52min at a time up to 3 times per day. The Meloxicam should also help your back pain, if the pain worsens, you can discuss with the Orthopedic provider, or I can order physical therapy first to try.      PLEASE NOTE:  If you had any LAB tests please let us know if you have not heard back within a few days. You may see your results on MyChart before we have a chance to review them but we will give you a call once they are reviewed by [place]. If we ordered any REFERRALS today, please let us know if you have not heard from their office within the next week.  Let us know through MyChart if you are needing REFILLS, or have your pharmacy send [place] the request. You can also use MyChart to communicate with me or any office staff.  Please try these tips to maintain a healthy lifestyle:  Eat most of your calories during the day when you are active. Eliminate processed foods including packaged sweets (pies, cakes, cookies), reduce intake of potatoes, white bread, white pasta, and white rice. Look for whole grain options, oat flour or almond flour.  Each meal should contain half fruits/vegetables, one quarter protein, and one quarter carbs (no bigger than a computer mouse).  Cut down on sweet beverages. This includes juice, soda, and sweet tea. Also watch fruit intake, though this is a healthier sweet option, it still contains natural sugar! Limit to 3 servings daily.  Drink at least 1 glass of water with each meal and aim for at least 8 glasses  per day  Exercise at least 150 minutes every week.

## 2021-10-01 ENCOUNTER — Encounter: Payer: Self-pay | Admitting: Nurse Practitioner

## 2021-10-04 ENCOUNTER — Encounter: Payer: Self-pay | Admitting: Orthopaedic Surgery

## 2021-10-04 ENCOUNTER — Ambulatory Visit (INDEPENDENT_AMBULATORY_CARE_PROVIDER_SITE_OTHER): Payer: 59 | Admitting: Orthopaedic Surgery

## 2021-10-04 ENCOUNTER — Other Ambulatory Visit: Payer: Self-pay

## 2021-10-04 VITALS — Ht 59.0 in | Wt 145.0 lb

## 2021-10-04 DIAGNOSIS — M1712 Unilateral primary osteoarthritis, left knee: Secondary | ICD-10-CM | POA: Diagnosis not present

## 2021-10-04 NOTE — Progress Notes (Signed)
Office Visit Note   Patient: Beverly Trevino           Date of Birth: [birth year]           MRN: [id number] Visit Date: 10/04/2021              Requested by: Jeanie Sewer, NP [place],  [place] [id number] PCP: Jeanie Sewer, NP   Assessment & Plan: Visit Diagnoses:  1. Primary osteoarthritis of left knee     Plan: Impression is left knee osteoarthritis exacerbation.  Options were given for cortisone injection versus continued NSAIDs and relative rest.  Patient opted for continued medications and will follow-up if she changes her mind and wants a cortisone injection.  Follow-Up Instructions: No follow-ups on file.   Orders:  No orders of the defined types were placed in this encounter.  No orders of the defined types were placed in this encounter.     Procedures: No procedures performed   Clinical Data: No additional findings.   Subjective: Chief Complaint  Patient presents with   Left Knee - Pain    HPI  Patient is a 66 year old Hispanic female here with interpreter for valuation of chronic left knee pain for a month of insidious onset.  She had originally severe pain that was throughout the knee and could not weight-bear.  She saw her PCP and was prescribed diclofenac gel and meloxicam which is gotten her pain down to a 5 out of 10.  She still has pain when she moves and with weightbearing.  Denies any mechanical symptoms.  Denies history of gout.  Review of Systems  Constitutional: Negative.   HENT: Negative.    Eyes: Negative.   Respiratory: Negative.    Cardiovascular: Negative.   Endocrine: Negative.   Musculoskeletal: Negative.   Neurological: Negative.   Hematological: Negative.   Psychiatric/Behavioral: Negative.    All other systems reviewed and are negative.   Objective: Vital Signs: Ht 4\' 11"  (1.499 m)    Wt 145 lb (65.8 kg)    BMI 29.29 kg/m   Physical Exam Vitals and nursing note reviewed.  Constitutional:       Appearance: She is well-developed.  HENT:     Head: Normocephalic and atraumatic.  Pulmonary:     Effort: Pulmonary effort is normal.  Abdominal:     Palpations: Abdomen is soft.  Musculoskeletal:     Cervical back: Neck supple.  Skin:    General: Skin is warm.     Capillary Refill: Capillary refill takes less than 2 seconds.  Neurological:     Mental Status: She is alert and oriented to person, place, and time.  Psychiatric:        Behavior: Behavior normal.        Thought Content: Thought content normal.        Judgment: Judgment normal.    Ortho Exam  Examination of the left knee shows trace effusion.  Range of motion is minimally limited.  No significant pain with range of motion.  Mild crepitus with range of motion.  Collaterals and cruciates are stable.  Specialty Comments:  No specialty comments available.  Imaging: No results found.   PMFS History: Patient Active Problem List   Diagnosis Date Noted   Acute pain of left knee 09/26/2021   Chronic bilateral low back pain without sciatica 09/26/2021   Acquired hypothyroidism 09/26/2021   Essential hypertension 09/26/2021   Syncope 01/25/2021   Past Medical History:  Diagnosis Date  Chest pain 01/25/2021   Hyperlipidemia     No family history on file.  Past Surgical History:  Procedure Laterality Date   CHOLECYSTECTOMY     Social History   Occupational History   Not on file  Tobacco Use   Smoking status: Never   Smokeless tobacco: Never  Vaping Use   Vaping Use: Never used  Substance and Sexual Activity   Alcohol use: Never   Drug use: Never   Sexual activity: Not on file

## 2021-10-13 NOTE — Progress Notes (Signed)
10/13/2021 Beverly Trevino [id number] Feb 10, 1956   CHIEF COMPLAINT: Schedule a colonoscopy and upper endoscopy  HISTORY OF PRESENT ILLNESS: Beverly Trevino is a 66 year old female with a past medical history of anxiety, depression, hypertension, hyperlipidemia and hypothyroidism. S/P cholecystectomy secondary to gallstones in 2009.  She presents to our office today to as referred by Gershon Crane NP to schedule a screening colonoscopy. She speaks Spanish therefore she is accompanied by a [place] interpretor who was present throughout today's consult to facilitate communication.  She stated she wishes to schedule an upper endoscopy and screening colonoscopy.  She complains of having LUQ pain with abdominal bloat which occurs after eating most foods in the past several months. She has infrequent heartburn.  She has occasional dysphagia described as food which gets stuck in her throat/upper esophagus which passes after she drinks water and occurs approximately every 2 weeks for the past year.  She feels she has difficulty digesting meat, eggs and dairy products.  She recently started taking an over-the-counter digestive enzyme and it is unclear if she has seen any benefit at this point.  She is taking Meloxicam 15 mg daily for the past 3 weeks due to having left knee pain.  She has occasional constipation which is relieved after she takes baking soda mixed in water.  She typically passes a brown to yellowish-brown stool most days.  No rectal bleeding or black stools.  No lower abdominal pain.  No other complaints at this time.  Patient was admitted to the hospital with CP and syncope 01/2021. Cardiac enzymes done during the hospital stay came back negative.  Nuclear stress test to rule out ischemia also came back negative.  Cardiology team was consulted to assist with syncope work-up. Syncope was felt to be secondary to volume depletion and hypotension.  No further work-up was  recommended by the cardiology team. She denies having any recurrent chest pain.  CBC Latest Ref Rng & Units 01/27/2021 01/26/2021 01/25/2021  WBC 4.0 - 10.5 K/uL 14.1(H) 17.0(H) 17.2(H)  Hemoglobin 12.0 - 15.0 g/dL 14.3 15.8(H) 15.6(H)  Hematocrit 36.0 - 46.0 % 43.8 47.9(H) 46.7(H)  Platelets 150 - 400 K/uL 293 334 307    CMP Latest Ref Rng & Units 01/27/2021 01/26/2021 01/25/2021  Glucose 70 - 99 mg/dL 98 108(H) 84  BUN 8 - 23 mg/dL 18 25(H) 25(H)  Creatinine 0.44 - 1.00 mg/dL 0.52 1.02(H) 0.65  Sodium 135 - 145 mmol/L 137 134(L) 130(L)  Potassium 3.5 - 5.1 mmol/L 4.2 4.4 4.3  Chloride 98 - 111 mmol/L 101 97(L) 92(L)  CO2 22 - 32 mmol/L 25 25 25   Calcium 8.9 - 10.3 mg/dL 8.8(L) 9.3 9.6     Past Medical History:  Diagnosis Date   Chest pain 01/25/2021   Hyperlipidemia    Past Surgical History:  Procedure Laterality Date   CHOLECYSTECTOMY     Social History: She is married. She has one son and one daughter. She smoked 10 cigarettes from the age of 43 to 37 yrs. No alcjohol.   Family History: No family history of esophageal, gastric or colon cancer. Paternal grandmother had some form of cancer.   Allergies  Allergen Reactions   Lactose Intolerance (Gi)    Other     "All meat"      Outpatient Encounter Medications as of 10/15/2021  Medication Sig   diclofenac Sodium (VOLTAREN) 1 % GEL Apply 4 g topically 4 (four) times daily as needed. to left knee  levothyroxine (SYNTHROID) 100 MCG tablet Take 1 tablet (100 mcg total) by mouth every morning.   losartan (COZAAR) 50 MG tablet Take 0.5 tablets (25 mg total) by mouth 2 (two) times daily.   meloxicam (MOBIC) 15 MG tablet Take 1 tablet (15 mg total) by mouth daily. for back and knee pain   Omega-3 Fatty Acids (FISH OIL) 1000 MG CAPS Take by mouth.   No facility-administered encounter medications on file as of 10/15/2021.   REVIEW OF SYSTEMS:  Gen: Denies fever, sweats or chills. No weight loss.  CV: Denies chest pain, palpitations or  edema. Resp: Denies cough, shortness of breath of hemoptysis.  GI: See HPI  GU : Denies urinary burning, blood in urine, increased urinary frequency or incontinence. MS: + Arthritis, back pain and muscle cramps.  Derm: Denies rash, itchiness, skin lesions or unhealing ulcers. Psych: + Past anxiety and depression.  Heme: Denies bruising, easy bleeding. Neuro:  Denies headaches, dizziness or paresthesias. Endo:  Denies any problems with DM, thyroid or adrenal function.  PHYSICAL EXAM: BP 130/70 (BP Location: Left Arm, Patient Position: Sitting, Cuff Size: Normal)    Pulse 100    Ht 4' 11.5" (1.511 m) Comment: height measured without shoes   Wt 148 lb 2 oz (67.2 kg)    BMI 29.42 kg/m   General: 66 year old female in no acute distress. Head: Normocephalic and atraumatic. Eyes:  Sclerae non-icteric, conjunctive pink. Ears: Normal auditory acuity. Mouth: Dentition intact. No ulcers or lesions.  Neck: Supple, no lymphadenopathy or thyromegaly.  Lungs: Clear bilaterally to auscultation without wheezes, crackles or rhonchi. Heart: Regular rate and rhythm. No murmur, rub or gallop appreciated.  Abdomen: Soft, nontender, non distended. No masses. No hepatosplenomegaly. Normoactive bowel sounds x 4 quadrants. Lower midline scar intact. Rectal: Deferred.  Musculoskeletal: Symmetrical with no gross deformities. Skin: Warm and dry. No rash or lesions on visible extremities. Extremities: No edema. Neurological: Alert oriented x 4, no focal deficits.  Psychological:  Alert and cooperative. Normal mood and affect.  ASSESSMENT AND PLAN:  70) 67 year old female presents to schedule a screening colonoscopy  -Colonoscopy benefits and risks discussed including risk with sedation, risk of bleeding, perforation and infection   2) LUQ pain with bloat for several months. On Meloxicam QD x 3 weeks.  -CBC, CMP and lipase level  -Omeprazole 20 mg 1 p.o. daily -EGD benefits and risks discussed including risk  with sedation, risk of bleeding, perforation and infection   3) Dysphagia, intermittent x 1 year  -See plan in # 2  4) Constipation -Fiber supplement of choice -Miralax PRN  Further recommendation to be determined after the above evaluation completed        CC:  Jeanie Sewer, NP

## 2021-10-15 ENCOUNTER — Encounter: Payer: Self-pay | Admitting: Nurse Practitioner

## 2021-10-15 ENCOUNTER — Ambulatory Visit: Payer: 59 | Admitting: Nurse Practitioner

## 2021-10-15 ENCOUNTER — Other Ambulatory Visit (INDEPENDENT_AMBULATORY_CARE_PROVIDER_SITE_OTHER): Payer: 59

## 2021-10-15 VITALS — BP 130/70 | HR 100 | Ht 59.5 in | Wt 148.1 lb

## 2021-10-15 DIAGNOSIS — R1013 Epigastric pain: Secondary | ICD-10-CM | POA: Diagnosis not present

## 2021-10-15 DIAGNOSIS — R1012 Left upper quadrant pain: Secondary | ICD-10-CM | POA: Diagnosis not present

## 2021-10-15 DIAGNOSIS — Z1211 Encounter for screening for malignant neoplasm of colon: Secondary | ICD-10-CM

## 2021-10-15 DIAGNOSIS — R1310 Dysphagia, unspecified: Secondary | ICD-10-CM

## 2021-10-15 LAB — COMPREHENSIVE METABOLIC PANEL
ALT: 31 U/L (ref 0–35)
AST: 27 U/L (ref 0–37)
Albumin: 4.9 g/dL (ref 3.5–5.2)
Alkaline Phosphatase: 66 U/L (ref 39–117)
BUN: 10 mg/dL (ref 6–23)
CO2: 29 mEq/L (ref 19–32)
Calcium: 10.2 mg/dL (ref 8.4–10.5)
Chloride: 101 mEq/L (ref 96–112)
Creatinine, Ser: 0.7 mg/dL (ref 0.40–1.20)
GFR: 91 mL/min (ref >60.00)
Glucose, Bld: 88 mg/dL (ref 70–99)
Potassium: 4.2 mEq/L (ref 3.5–5.1)
Sodium: 138 mEq/L (ref 135–145)
Total Bilirubin: 0.9 mg/dL (ref 0.2–1.2)
Total Protein: 8.6 g/dL — ABNORMAL HIGH (ref 6.0–8.3)

## 2021-10-15 LAB — LIPASE: Lipase: 28 U/L (ref 11.0–59.0)

## 2021-10-15 LAB — CBC WITH DIFFERENTIAL/PLATELET
Basophils Absolute: 0.1 10*3/uL (ref 0.0–0.1)
Basophils Relative: 0.8 % (ref 0.0–3.0)
Eosinophils Absolute: 0.1 10*3/uL (ref 0.0–0.7)
Eosinophils Relative: 1.3 % (ref 0.0–5.0)
HCT: 41.5 % (ref 36.0–46.0)
Hemoglobin: 13.8 g/dL (ref 12.0–15.0)
Lymphocytes Relative: 35.4 % (ref 12.0–46.0)
Lymphs Abs: 3.5 10*3/uL (ref 0.7–4.0)
MCHC: 33.2 g/dL (ref 30.0–36.0)
MCV: 83.8 fl (ref 78.0–100.0)
Monocytes Absolute: 0.6 10*3/uL (ref 0.1–1.0)
Monocytes Relative: 6.4 % (ref 3.0–12.0)
Neutro Abs: 5.6 10*3/uL (ref 1.4–7.7)
Neutrophils Relative %: 56.1 % (ref 43.0–77.0)
Platelets: 265 10*3/uL (ref 150.0–400.0)
RBC: 4.95 Mil/uL (ref 3.87–5.11)
RDW: 13.9 % (ref 11.5–15.5)
WBC: 9.9 10*3/uL (ref 4.0–10.5)

## 2021-10-15 MED ORDER — OMEPRAZOLE 20 MG PO CPDR: 20.0000 mg | DELAYED_RELEASE_CAPSULE | Freq: Every day | ORAL | 5 refills | Status: DC

## 2021-10-15 NOTE — Patient Instructions (Signed)
Se le ha programado una EGD y Ardelia Mems colonoscopia. Siga las instrucciones escritas que se le dieron en su visita de hoy. Si [place] inhaladores (aunque solo sea necesario), trigalos el da de su procedimiento.  Dirjase al nivel del stano para el trabajo de laboratorio antes de salir hoy. Presione "B" en el ascensor. El laboratorio est ubicado en la primera puerta a la izquierda al salir del Materials engineer.  LEYES DE ATENCIN MDICA Y RESULTADOS DE MI CUADRO:  Debido a los cambios recientes en las leyes de atencin mdica, es posible que vea los [place] de sus estudios de imgenes y/o de laboratorio en MyChart antes de que yo haya tenido la oportunidad de Building control surveyor. Entiendo que en algunos casos puede haber resultados confusos o preocupantes para usted. Comprenda que no todos los [employer] se reciben al mismo tiempo y, a [place], es posible que deba interpretar varios resultados para brindarle el mejor plan de atencin o curso de [place]. Por lo tanto, le pido que me d 47 horas para revisar a fondo todos sus resultados antes de comunicarse con mi oficina para Media planner.  Se ha enviado omeprazol a la farmacia. Tomar una vez al da.   Enfermedad de reflujo gastroesofgico en los adultos Gastroesophageal Reflux Disease, Adult El reflujo gastroesofgico (RGE) ocurre cuando el cido del estmago sube por el tubo que conecta la boca con el estmago (esfago). Normalmente, la comida baja por el esfago y se mantiene en el estmago, donde se la digiere. Cuando una persona tiene RGE, los alimentos y el cido estomacal suelen volver al esfago. Usted puede tener una enfermedad llamada enfermedad de reflujo gastroesofgico (ERGE) si el reflujo: Sucede a menudo. Causa sntomas frecuentes o muy intensos. Causa problemas tales como dao en el esfago. Cuando esto ocurre, el esfago duele y se hincha. Con el tiempo, la ERGE puede ocasionar pequeos agujeros (lceras) en el revestimiento del  esfago. Cules son las causas? Esta afeccin se debe a un problema en el msculo que se encuentra entre el esfago y [place]. Cuando este msculo est dbil o no es normal, no se cierra correctamente para impedir que los alimentos y el cido regresen del Paramedic. El msculo puede debilitarse debido a lo siguiente: El consumo de [place]. [place]. Tener cierto tipo de hernia (hernia de hiato). Consumo de alcohol. Ciertos alimentos y bebidas, como caf, chocolate, cebollas y [place]. Qu incrementa el riesgo? Tener sobrepeso. Tener una enfermedad que afecta el tejido conjuntivo. Tomar antiinflamatorios no esteroideos (AINE), como el ibuprofeno. Cules son los signos o sntomas? Acidez estomacal. Dificultad o dolor al tragar. Sensacin de Best boy un bulto en la garganta. Sabor amargo en la boca. Mal aliento. Tener una gran cantidad de saliva. Estmago inflamado o con Tree surgeon. Eructos. Dolor en el pecho. El dolor de pecho puede deberse a distintas afecciones. Asegrese de Teacher, adult education a su mdico si tiene Tourist information centre manager. Dificultad para respirar o sibilancias. Ardelia Mems tos a largo plazo o tos nocturna. Desgaste de la superficie de los dientes (esmalte dental). Prdida de peso. Cmo se trata? Realizar cambios en la dieta. Tomar medicamentos. Someterse a [place]. El tratamiento depender de la gravedad de los sntomas. Siga estas instrucciones en su casa: Comida y bebida  Siga una dieta como se lo haya indicado el mdico. Es posible que deba evitar alimentos y bebidas, por ejemplo: Caf y t negro, con o sin cafena. Bebidas que contengan alcohol. Bebidas energticas y deportivas. Bebidas gaseosas (carbonatadas) y refrescos. Chocolate y cacao. Menta y esencia de  menta. Ajo y cebolla. Rbano picante. Alimentos cidos y condimentados. Estos incluyen todos los tipos de pimientos, [place] en polvo, curry en polvo, vinagre, salsas picantes y [employer]. Ctricos y sus jugos,  por ejemplo, naranjas, limones y limas. Alimentos que [employer]. Estos incluyen salsa roja, [place], salsa picante y pizza con salsa de [place]. Alimentos fritos y Radio broadcast assistant. Estos incluyen donas, papas fritas, papitas fritas de bolsa y aderezos con alto contenido de [place]. Carnes con alto contenido de [place]. Estas incluye los perros calientes, chuletas o costillas, embutidos, jamn y tocino. Productos lcteos ricos en grasas, como leche [place], [place] y [place] crema. Consuma pequeas cantidades de comida con ms frecuencia. Evite consumir porciones abundantes. Evite beber grandes cantidades de lquidos con las comidas. Evite comer 2 o 3 horas antes de acostarse. Evite recostarse inmediatamente despus de comer. No haga ejercicios enseguida despus de comer. Estilo de vida  No fume ni consuma ningn producto que contenga nicotina o tabaco. Si necesita ayuda para dejar de consumir estos productos, consulte al mdico. Intente reducir el nivel de estrs. Si necesita ayuda para hacer esto, consulte al mdico. Si tiene sobrepeso, baje una cantidad de peso saludable para usted. Consulte a su mdico para bajar de peso de [employer]. Instrucciones generales Est atento a cualquier cambio en los sntomas. Tome los medicamentos de venta libre y los recetados solamente como se lo haya indicado el mdico. No tome aspirina, ibuprofeno ni otros antiinflamatorios no esteroideos (AINE) a menos que el mdico lo autorice. Use ropa holgada. No use nada apretado alrededor de la cintura. Levante (eleve) la cabecera de la cama aproximadamente 6 pulgadas (15 cm). Para hacerlo, es posible que tenga que utilizar una cua. Evite inclinarse si al hacerlo empeoran los sntomas. Cumpla con todas las visitas de seguimiento. Comunquese con un mdico si: Aparecen nuevos sntomas. [place]gaza y no sabe por qu. Tiene problemas para tragar o le duele cuando traga. Tiene sibilancias o tos persistente. Tiene la voz  ronca. Los sntomas no mejoran con Dispensing optician. Solicite ayuda de inmediato si: Tree surgeon repentino [employer], el cuello, la [place], los dientes o la espalda. De repente se siente transpirado, mareado o aturdido. Siente falta de aire o Tourist information centre manager. Vomita y el vmito es de color verde, amarillo o negro, o tiene un aspecto similar a la sangre o a los posos de caf. Se desmaya. Las heces (deposiciones) son rojas, sanguinolentas o negras. No puede tragar, beber o comer. Estos sntomas pueden representar un problema grave que constituye Engineer, maintenance (IT). No espere a ver si los sntomas desaparecen. Solicite atencin mdica de inmediato. Comunquese con el servicio de emergencias de su localidad (911 en los [place]). No conduzca por sus propios medios [employer] Financial. Resumen Si una persona tiene enfermedad de reflujo gastroesofgico (ERGE), los alimentos y el cido estomacal suben al esfago y causan sntomas o problemas tales como dao en el esfago. El tratamiento depender de la gravedad de los sntomas. Siga una dieta como se lo haya indicado el mdico. Use todos los medicamentos solamente como se lo haya indicado el mdico. Esta informacin no tiene Marine scientist el consejo del mdico. Asegrese de hacerle al mdico cualquier pregunta que tenga. Document Revised: 03/17/2020 Document Reviewed: 03/17/2020 Elsevier Patient Education  [place]. BMI:  If you are age 65 or older, your body mass index should be between 23-30. Your Body mass index is 29.42 kg/m. If this is out of the aforementioned range listed, please consider  follow up with your Primary Care Provider.  If you are age 71 or younger, your body mass index should be between 19-25. Your Body mass index is 29.42 kg/m. If this is out of the aformentioned range listed, please consider follow up with your Primary Care Provider.   MY CHART:  The [place] [facility] providers would like to encourage you  to use [facility] to communicate with providers for non-urgent requests or questions.  Due to long hold times on the telephone, sending your provider a message by [facility] may be a faster and more efficient way to get a response.  Please allow 48 business hours for a response.  Please remember that this is for non-urgent requests.   Thank you for trusting me with your gastrointestinal care!    Noralyn Pick, CRNP

## 2021-10-16 NOTE — Progress Notes (Signed)
I agree with the above note, plan 

## 2021-10-25 ENCOUNTER — Other Ambulatory Visit (INDEPENDENT_AMBULATORY_CARE_PROVIDER_SITE_OTHER): Payer: 59

## 2021-10-25 DIAGNOSIS — E039 Hypothyroidism, unspecified: Secondary | ICD-10-CM | POA: Diagnosis not present

## 2021-10-25 DIAGNOSIS — I10 Essential (primary) hypertension: Secondary | ICD-10-CM

## 2021-10-25 LAB — TSH: TSH: 2.03 u[IU]/mL (ref 0.35–5.50)

## 2021-10-25 LAB — T4, FREE: Free T4: 1.18 ng/dL (ref 0.60–1.60)

## 2021-10-26 ENCOUNTER — Other Ambulatory Visit: Payer: Self-pay | Admitting: Family

## 2021-10-26 DIAGNOSIS — Z13220 Encounter for screening for lipoid disorders: Secondary | ICD-10-CM

## 2021-10-26 NOTE — Progress Notes (Signed)
thyroid levels in normal range, continue same dose of Levothyroxine.

## 2021-10-26 NOTE — Progress Notes (Signed)
so she had other labs done through the gastroenterology office and they would have been same labs for me and they were normal.  ?the only one missing is a lipid panel and I have entered the order if she wants to return for a lab only appointment - fasting - thx.

## 2021-11-05 ENCOUNTER — Ambulatory Visit: Payer: 59

## 2021-11-12 ENCOUNTER — Encounter: Payer: Self-pay | Admitting: Gastroenterology

## 2021-11-13 ENCOUNTER — Telehealth: Payer: Self-pay | Admitting: Gastroenterology

## 2021-11-13 ENCOUNTER — Other Ambulatory Visit: Payer: Self-pay | Admitting: Family

## 2021-11-13 DIAGNOSIS — M25562 Pain in left knee: Secondary | ICD-10-CM

## 2021-11-13 DIAGNOSIS — M545 Low back pain, unspecified: Secondary | ICD-10-CM

## 2021-11-13 NOTE — Telephone Encounter (Signed)
Patient called to discuss procedure. Per patient, no prep medication sent to pharmacy. Patient has a procedure this Friday 11/16/21. Please advise. ?

## 2021-11-13 NOTE — Telephone Encounter (Signed)
Prep was not sent because she is doing over the counter prep. This was explained at her office visit and outlined in her instructions we gave her. She can also access that in [place]. She just needs to buy over the counter Miralax/Gatorade/Dulcolax. Thank you! ? ? ?Purchase a box of Dulcolax (Bisacodyl) 5 mg tablets( 4 tabletas de Dulcolax o bisacodil 5 miligramos), 238 gram bottle of Miralax (Un envase grande de miralax 238 gramos)  and a 64 ounce bottle of Gatorade (no Red , No purple)  (64 onzas Gatorade-no rojo o purpura) at your pharmacy-en la pharmacia  ?

## 2021-11-16 ENCOUNTER — Encounter: Payer: Self-pay | Admitting: Gastroenterology

## 2021-11-16 ENCOUNTER — Ambulatory Visit (AMBULATORY_SURGERY_CENTER): Payer: 59 | Admitting: Gastroenterology

## 2021-11-16 VITALS — BP 117/65 | HR 83 | Temp 98.4°F | Resp 13 | Ht 59.0 in | Wt 148.0 lb

## 2021-11-16 DIAGNOSIS — R1012 Left upper quadrant pain: Secondary | ICD-10-CM

## 2021-11-16 DIAGNOSIS — D122 Benign neoplasm of ascending colon: Secondary | ICD-10-CM

## 2021-11-16 DIAGNOSIS — Z1211 Encounter for screening for malignant neoplasm of colon: Secondary | ICD-10-CM

## 2021-11-16 DIAGNOSIS — R1013 Epigastric pain: Secondary | ICD-10-CM

## 2021-11-16 DIAGNOSIS — R1310 Dysphagia, unspecified: Secondary | ICD-10-CM | POA: Diagnosis not present

## 2021-11-16 MED ORDER — SODIUM CHLORIDE 0.9 % IV SOLN: 500.0000 mL | Freq: Once | INTRAVENOUS | Status: DC

## 2021-11-16 NOTE — Progress Notes (Signed)
HPI: ?This is a woman at routine risk for CRC, dysphagia ? ? ?ROS: complete GI ROS as described in HPI, all other review negative. ? ?Constitutional:  No unintentional weight loss ? ? ?Past Medical History:  ?Diagnosis Date  ? Arthritis   ? Chest pain 01/25/2021  ? HTN (hypertension)   ? Hyperlipidemia   ? Hypothyroidism   ? ? ?Past Surgical History:  ?Procedure Laterality Date  ? CHOLECYSTECTOMY    ? UTERINE FIBROID SURGERY    ? ? ?Current Outpatient Medications  ?Medication Sig Dispense Refill  ? diclofenac Sodium (VOLTAREN) 1 % GEL Apply 4 g topically 4 (four) times daily as needed. to left knee 100 g 3  ? levothyroxine (SYNTHROID) 100 MCG tablet Take 1 tablet (100 mcg total) by mouth every morning. 90 tablet 0  ? losartan (COZAAR) 50 MG tablet Take 0.5 tablets (25 mg total) by mouth 2 (two) times daily. 90 tablet 0  ? meloxicam (MOBIC) 15 MG tablet TAKE 1 TABLET BY MOUTH ONCE DAILY FOR  BACK  AND  KNEE  PAIN 30 tablet 0  ? Omega-3 Fatty Acids (FISH OIL) 1000 MG CAPS Take by mouth.    ? omeprazole (PRILOSEC) 20 MG capsule Take 1 capsule (20 mg total) by mouth daily. 30 capsule 5  ? ?Current Facility-Administered Medications  ?Medication Dose Route Frequency Provider Last Rate Last Admin  ? 0.9 %  sodium chloride infusion  500 mL Intravenous Once Milus Banister, MD      ? ? ?Allergies as of 11/16/2021 - Review Complete 11/16/2021  ?Allergen Reaction Noted  ? Lactose intolerance (gi)  01/25/2021  ? Other  01/25/2021  ? ? ?Family History  ?Problem Relation Age of Onset  ? Migraines Mother   ? Heart disease Father   ? Thyroid disease Sister   ? Thyroid disease Sister   ? Thyroid disease Brother   ? Thyroid disease Paternal Grandmother   ? ? ?Social History  ? ?Socioeconomic History  ? Marital status: Married  ?  Spouse name: Not on file  ? Number of children: 2  ? Years of education: Not on file  ? Highest education level: Not on file  ?Occupational History  ? Not on file  ?Tobacco Use  ? Smoking status: Never  ?  Smokeless tobacco: Never  ?Vaping Use  ? Vaping Use: Never used  ?Substance and Sexual Activity  ? Alcohol use: Never  ? Drug use: Never  ? Sexual activity: Not on file  ?Other Topics Concern  ? Not on file  ?Social History Narrative  ? Lives in [place]  ? ?Social Determinants of Health  ? ?Financial Resource Strain: Not on file  ?Food Insecurity: Not on file  ?Transportation Needs: Not on file  ?Physical Activity: Not on file  ?Stress: Not on file  ?Social Connections: Not on file  ?Intimate Partner Violence: Not on file  ? ? ? ?Physical Exam: ?BP 139/67   Pulse 70   Temp 98.4 ?F (36.9 ?C) (Skin)   Ht '4\' 11"'$  (1.499 m)   Wt 148 lb (67.1 kg)   SpO2 99%   BMI 29.89 kg/m?  ?Constitutional: generally well-appearing ?Psychiatric: alert and oriented x3 ?Lungs: CTA bilaterally ?Heart: no MCR ? ?Assessment and plan: ?66 y.o. female with dysphagia, epig pain, routine risk for CRC ? ?EGD and screening colonoscopy todya ? ?Care is appropriate for the ambulatory setting. ? ?Owens Loffler, MD ?[facility] ?11/16/2021, 2:22 PM ? ? ? ?

## 2021-11-16 NOTE — Progress Notes (Signed)
Interpeter Beverly Trevino assisted with DC instructions. ?

## 2021-11-16 NOTE — Patient Instructions (Signed)
Stay on Omeprazole 20 mg  daily. Take 1 pill shortly before breakfast. ?Please see handouts given to you on Polyps and Hemorrhoids. ? ? ?[name] PROCEDIMIENTO ENDOSC?PICO HOY EN [facility]:   Lea el informe del procedimiento que se le entreg? para cualquier pregunta espec?fica sobre lo que se encontr? Education administrator.  Si el informe del examen no responde a sus preguntas, por favor llame a su gastroenter?logo para aclararlo.  Si usted solicit? que no se le den [employer] de lo que se Estate manager/land agent? en su procedimiento al acompa?ante que le va a cuidar, entonces el informe del procedimiento se ha incluido en un sobre sellado para que usted lo revise despu?s cuando le sea m?s conveniente. ?  ?LO QUE PUEDE ESPERAR: Algunas sensaciones de hinchaz?n en el abdomen.  Puede tener m?s gases de lo normal.  El caminar puede ayudarle a eliminar el aire que se le puso en el tracto gastrointestinal durante el procedimiento y reducir la hinchaz?n.  Si le hicieron una endoscopia inferior (como una colonoscopia o una sigmoidoscopia flexible), podr?a notar manchas de sangre en las heces fecales o en el papel higi?nico.  Si se someti? a una preparaci?n intestinal para su procedimiento, es posible que no tenga una evacuaci?n intestinal normal durante algunos d?as. ?  ?Tenga en cuenta:  Es posible que note un poco de irritaci?n y congesti?n en la nariz o alg?n drenaje.  Esto es debido al ox?geno utilizado durante su procedimiento.  No hay que preocuparse y esto debe desaparecer m?s o menos en un d?a. ?  ?S?NTOMAS PARA REPORTAR INMEDIATAMENTE: ? Despu?s de una endoscopia inferior (colonoscopia o sigmoidoscopia flexible): ? Cantidades excesivas de sangre en las heces fecales ? Sensibilidad significativa o empeoramiento de los dolores abdominales  ? Hinchaz?n aguda del abdomen que antes no ten?a  ? Fiebre de 100?F o m?s ?  ?Despu?s de la endoscopia superior (EGD) ? V?mitos de sangre o material como caf? molido  ? Dolor en  el pecho o dolor debajo de los om?platos que antes no ten?a  ? Dolor o dificultad persistente para tragar ? Falta de aire que antes no ten?a  ? Fiebre de 100?F o m?s ? Heces fecales negras y pegajosas ?  ?Para asuntos urgentes o de emergencia, puede comunicarse con un gastroenter?logo a cualquier hora llamando al [phone]. ? ?DIETA:  Recomendamos una comida peque?a al principio, pero luego puede continuar con su dieta normal.  Tome muchos l?quidos, Teacher, adult education las bebidas alcoh?licas durante 24 horas.  ?  ?ACTIVIDAD:  Debe planear tomarse las cosas con calma por el resto del d?a y no debe CONDUCIR ni usar maquinaria pesada hasta ma?ana (debido a los medicamentos de sedaci?n Furniture conservator/restorer).   ?  ?SEGUIMIENTO: ?Teacher, music? al n?mero que aparece en su historial al siguiente d?a h?bil de su procedimiento para ver c?mo se siente y para responder cualquier pregunta o inquietud que pueda tener con respecto a la informaci?n que se le dio despu?s del procedimiento. Si no podemos contactarle, le dejaremos un mensaje.  Sin embargo, si se siente bien y no tiene ning?n problema, no es necesario que nos devuelva la llamada.  Asumiremos que ha regresado a sus actividades diarias normales sin incidentes. ?Si se le tomaron algunas biopsias, le contactaremos por tel?fono o por carta en las pr?ximas 3 semanas.  Si no ha sabido [employer] biopsias en el transcurso de 3 semanas, por favor ll?menos al [phone].  ? ?  FIRMAS/CONFIDENCIALIDAD: ?Usted y/o el acompa?ante que le cuide han firmado documentos que se ingresar?n en su historial m?dico electr?nico.  Estas firmas atestiguan el hecho de que la informaci?n anterior   ?

## 2021-11-16 NOTE — Progress Notes (Signed)
Report given to PACU, vss 

## 2021-11-16 NOTE — Progress Notes (Signed)
1527 Robinul 0.1 mg IV given due large amount of secretions upon assessment.  MD made aware, vss  ?

## 2021-11-16 NOTE — Op Note (Signed)
[place] ?Patient Name: Beverly Trevino ?Procedure Date: 11/16/2021 3:21 PM ?MRN: [id number] ?Endoscopist: Milus Banister , MD ?Age: 66 ?Referring MD:  ?Date of Birth: 1956-07-01 ?Gender: Female ?Account #: [id number] ?Procedure:                Upper GI endoscopy ?Indications:              Dysphagia ?Medicines:                Monitored Anesthesia Care ?Procedure:                Pre-Anesthesia Assessment: ?                          - Prior to the procedure, a History and Physical  ?                          was performed, and patient medications and  ?                          allergies were reviewed. The patient's tolerance of  ?                          previous anesthesia was also reviewed. The risks  ?                          and benefits of the procedure and the sedation  ?                          options and risks were discussed with the patient.  ?                          All questions were answered, and informed consent  ?                          was obtained. Prior Anticoagulants: The patient has  ?                          taken no previous anticoagulant or antiplatelet  ?                          agents. ASA Grade Assessment: II - A patient with  ?                          mild systemic disease. After reviewing the risks  ?                          and benefits, the patient was deemed in  ?                          satisfactory condition to undergo the procedure. ?                          After obtaining informed consent, the endoscope was  ?  passed under direct vision. Throughout the  ?                          procedure, the patient's blood pressure, pulse, and  ?                          oxygen saturations were monitored continuously. The  ?                          GIF HQ190 #[id number] was introduced through the  ?                          mouth, and advanced to the second part of duodenum.  ?                          The upper GI endoscopy was  accomplished without  ?                          difficulty. The patient tolerated the procedure  ?                          well. ?Scope In: ?Scope Out: ?Findings:                 The esophagus was normal. ?                          The stomach was normal. ?                          The examined duodenum was normal. ?Complications:            No immediate complications. Estimated blood loss:  ?                          None. ?Estimated Blood Loss:     Estimated blood loss: none. ?Impression:               - Normal UGI tract ?Recommendation:           - Patient has a contact number available for  ?                          emergencies. The signs and symptoms of potential  ?                          delayed complications were discussed with the  ?                          patient. Return to normal activities tomorrow.  ?                          Written discharge instructions were provided to the  ?                          patient. ?                          -  Resume previous diet. ?                          - Continue present medications. Stay on omeprazole  ?                          '20mg'$  pills, one pill shortly before breakfast every  ?                          morning. This seems to be really helping your  ?                          swallowing issues and pains. ?Milus Banister, MD ?11/16/2021 3:48:35 PM ?This report has been signed electronically. ?

## 2021-11-16 NOTE — Progress Notes (Signed)
Pt's states no medical or surgical changes since previsit or office visit. 

## 2021-11-16 NOTE — Progress Notes (Signed)
Called to room to assist during endoscopic procedure.  Patient ID and intended procedure confirmed with present staff. Received instructions for my participation in the procedure from the performing physician.  

## 2021-11-16 NOTE — Op Note (Signed)
[place] ?Patient Name: Beverly Trevino ?Procedure Date: 11/16/2021 3:22 PM ?MRN: [id number] ?Endoscopist: Milus Banister , MD ?Age: 66 ?Referring MD:  ?Date of Birth: 19-Aug-1956 ?Gender: Female ?Account #: [id number] ?Procedure:                Colonoscopy ?Indications:              Screening for colorectal malignant neoplasm ?Medicines:                Monitored Anesthesia Care ?Procedure:                Pre-Anesthesia Assessment: ?                          - Prior to the procedure, a History and Physical  ?                          was performed, and patient medications and  ?                          allergies were reviewed. The patient's tolerance of  ?                          previous anesthesia was also reviewed. The risks  ?                          and benefits of the procedure and the sedation  ?                          options and risks were discussed with the patient.  ?                          All questions were answered, and informed consent  ?                          was obtained. Prior Anticoagulants: The patient has  ?                          taken no previous anticoagulant or antiplatelet  ?                          agents. ASA Grade Assessment: II - A patient with  ?                          mild systemic disease. After reviewing the risks  ?                          and benefits, the patient was deemed in  ?                          satisfactory condition to undergo the procedure. ?                          After obtaining informed consent, the colonoscope  ?  was passed under direct vision. Throughout the  ?                          procedure, the patient's blood pressure, pulse, and  ?                          oxygen saturations were monitored continuously. The  ?                          Olympus CF-HQ190L ([id number]) Colonoscope was  ?                          introduced through the anus and advanced to the the  ?                          cecum,  identified by appendiceal orifice and  ?                          ileocecal valve. The colonoscopy was performed  ?                          without difficulty. The patient tolerated the  ?                          procedure well. The quality of the bowel  ?                          preparation was good. The ileocecal valve,  ?                          appendiceal orifice, and rectum were photographed. ?Scope In: 3:30:48 PM ?Scope Out: 3:40:25 PM ?Scope Withdrawal Time: 0 hours 7 minutes 31 seconds  ?Total Procedure Duration: 0 hours 9 minutes 37 seconds  ?Findings:                 A 2 mm polyp was found in the ascending colon. The  ?                          polyp was sessile. The polyp was removed with a  ?                          cold snare. Resection and retrieval were complete. ?                          Internal hemorrhoids were found. The hemorrhoids  ?                          were small. ?                          The exam was otherwise without abnormality on  ?                          direct and retroflexion views. ?Complications:  No immediate complications. Estimated blood loss:  ?                          None. ?Estimated Blood Loss:     Estimated blood loss: none. ?Impression:               - One 2 mm polyp in the ascending colon, removed  ?                          with a cold snare. Resected and retrieved. ?                          - Internal hemorrhoids. ?                          - The examination was otherwise normal on direct  ?                          and retroflexion views. ?Recommendation:           - EGD now. ?                          - Await pathology results. ?Milus Banister, MD ?11/16/2021 3:42:17 PM ?This report has been signed electronically. ?

## 2021-11-20 ENCOUNTER — Telehealth: Payer: Self-pay

## 2021-11-20 NOTE — Telephone Encounter (Signed)
?  Follow up Call- ? ? ?  11/16/2021  ?  2:16 PM  ?Call back number  ?Post procedure Call Back phone  # [phone]  ?Permission to leave phone message Yes  ?  ? ?Patient questions: ? ?Do you have a fever, pain , or abdominal swelling? No. ?Pain Score  0 * ? ?Have you tolerated food without any problems? Yes.   ? ?Have you been able to return to your normal activities? Yes.   ? ?Do you have any questions about your discharge instructions: ?Diet   No. ?Medications  No. ?Follow up visit  No. ? ?Do you have questions or concerns about your Care? No. ? ?Actions: ?* If pain score is 4 or above: ?No action needed, pain <4. ? ? ?

## 2021-11-25 ENCOUNTER — Encounter: Payer: Self-pay | Admitting: Gastroenterology

## 2022-05-13 ENCOUNTER — Encounter: Payer: Self-pay | Admitting: *Deleted

## 2022-07-06 ENCOUNTER — Other Ambulatory Visit: Payer: Self-pay | Admitting: Nurse Practitioner

## 2022-07-31 ENCOUNTER — Other Ambulatory Visit: Payer: Self-pay | Admitting: Nurse Practitioner

## 2022-07-31 NOTE — Telephone Encounter (Signed)
Please advise 

## 2022-08-01 ENCOUNTER — Encounter: Payer: Self-pay | Admitting: *Deleted

## 2023-02-24 IMAGING — CT CT ANGIO CHEST-ABD-PELV FOR DISSECTION W/ AND WO/W CM
2 of 7 series · 11 of 46 positions shown, 12 images · non-contrast
Comparison: Chest radiograph January 25, 2021

CLINICAL DATA: Chest and abdominal pain radiating to back

EXAM:
CT ANGIOGRAPHY CHEST, ABDOMEN AND PELVIS
TECHNIQUE: Non-contrast CT of the chest was initially obtained.

[Series 7: axial arterial · axial · arterial · 0.74mm/px · z∈[+983,+1514]mm · 8 of 203 slices shown, 9 images]
[im 13/203  soft-tissue]
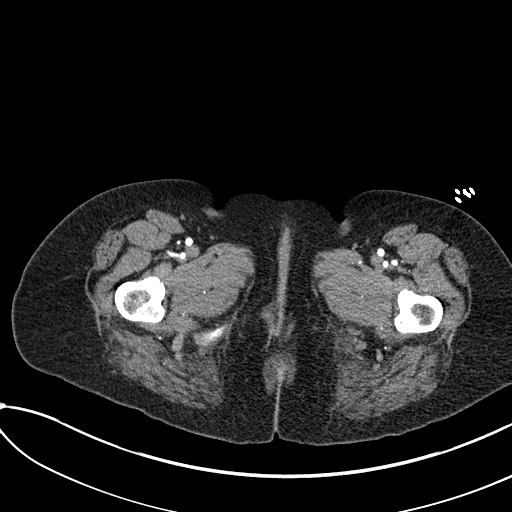
[im 13/203  bone]
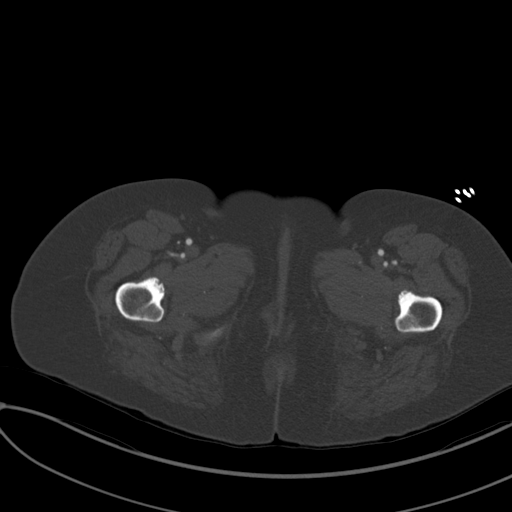
[im 38/203  soft-tissue]
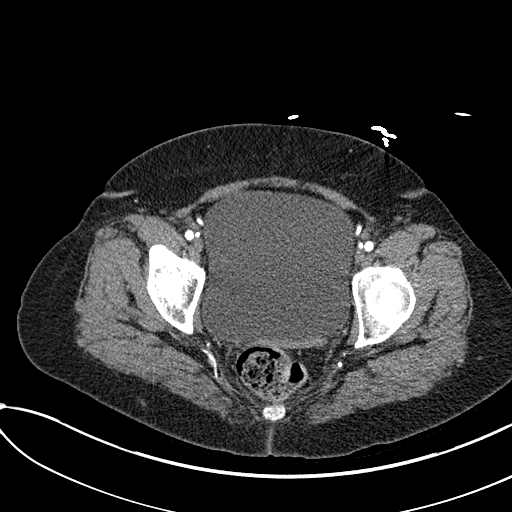
[im 64/203  soft-tissue]
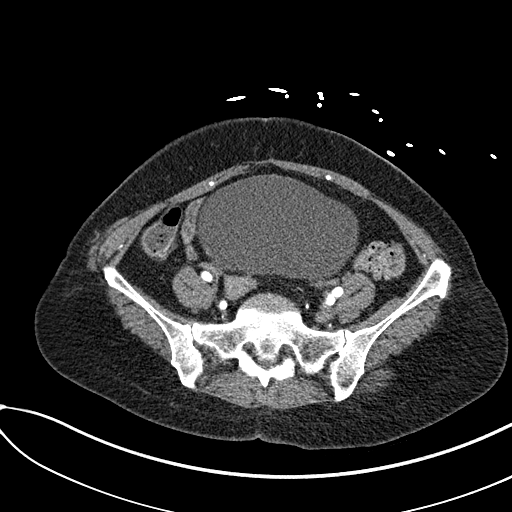
[im 89/203  soft-tissue]
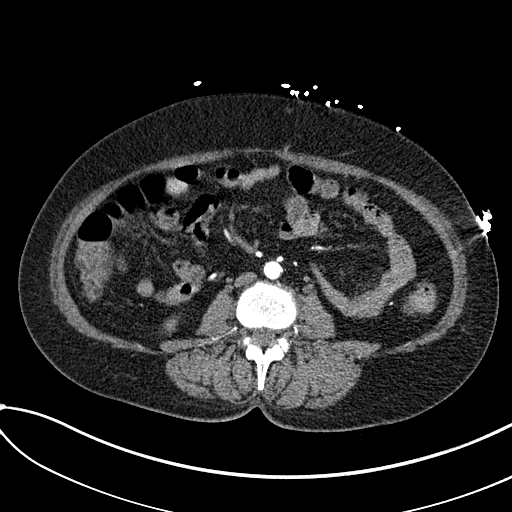
[im 114/203  soft-tissue]
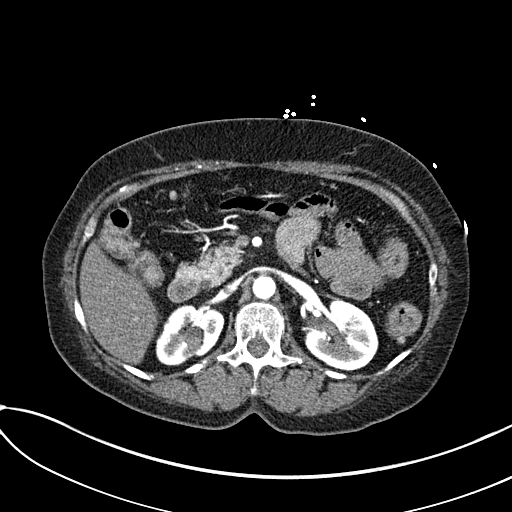
[im 139/203  soft-tissue]
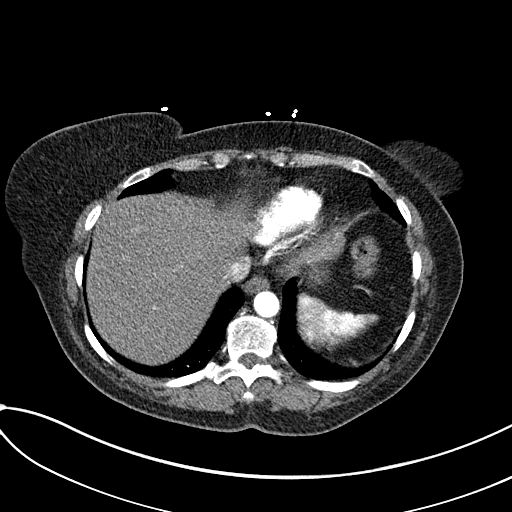
[im 165/203  soft-tissue]
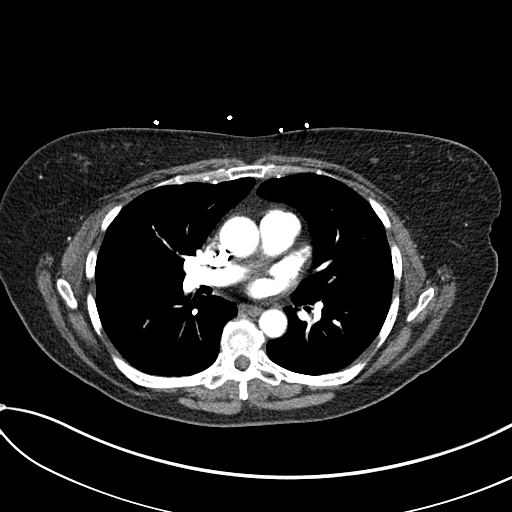
[im 190/203  soft-tissue]
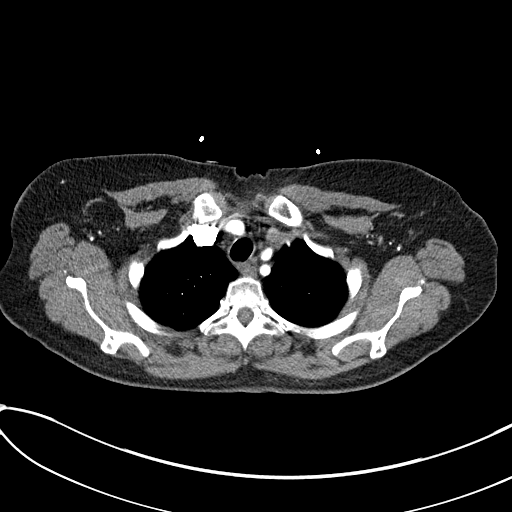

[Series 10: coronals · coronal · 0.76mm/px · 3 of 133 slices shown]
[im 34/133  soft-tissue]
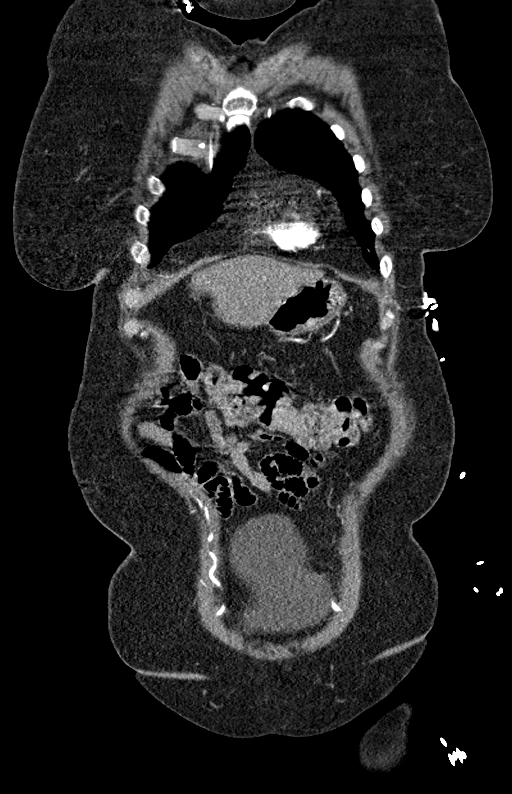
[im 67/133  soft-tissue]
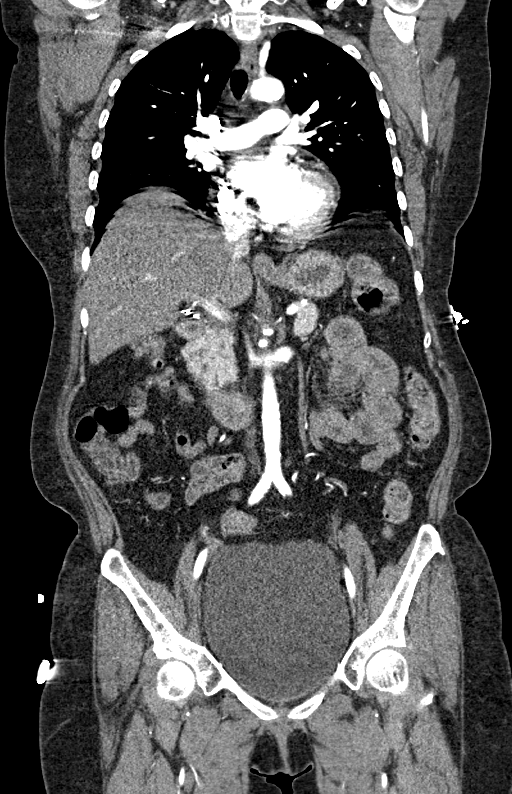
[im 100/133  soft-tissue]
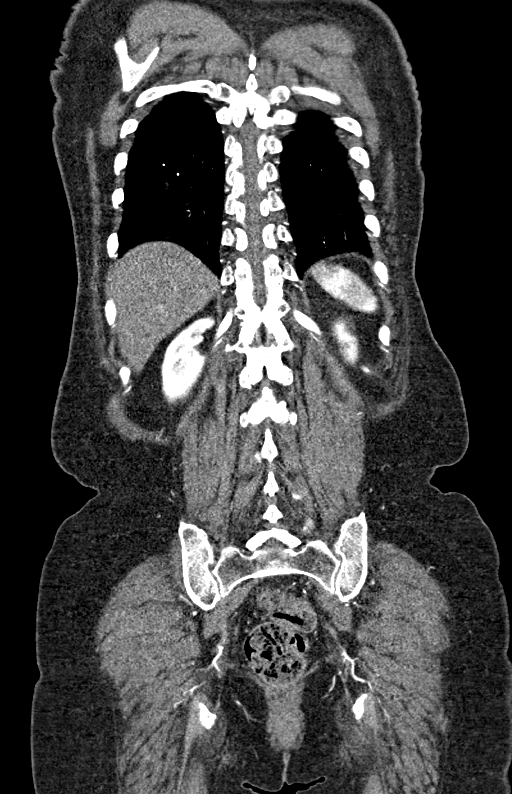

[11 of 46 positions shown; findings below may reference images not displayed]

Multidetector CT imaging through the chest, abdomen and pelvis was
performed using the standard protocol during bolus administration of
intravenous contrast. Multiplanar reconstructed images and MIPs were
obtained and reviewed to evaluate the vascular anatomy.

CONTRAST:  100mL OMNIPAQUE IOHEXOL 350 MG/ML SOLN
FINDINGS: CTA CHEST FINDINGS

Cardiovascular: There is no appreciable intramural hematoma on
noncontrast enhanced study.

There is no evident mediastinal hematoma. There is no appreciable
thoracic aortic aneurysm or dissection. There is aortic
atherosclerosis. No plaque ulceration evident. There is slight
calcification at the origins of the right innominate and left
subclavian artery. Visualized great vessels otherwise appear normal.
No aneurysm or dissection involving visualized great vessels. There
is no appreciable pulmonary embolus. No pericardial effusion or
pericardial thickening evident.

Mediastinum/Nodes: No thyroid lesions evident. No evident thoracic
adenopathy. No esophageal lesions.

Lungs/Pleura: There is minimal bibasilar atelectasis. Lungs
otherwise are clear. No pleural effusions. No pneumothorax. Trachea
and major bronchial structures appear patent.

Musculoskeletal: No evident fracture or dislocation. No blastic or
lytic bone lesions. No chest wall lesions.

Review of the MIP images confirms the above findings.

CTA ABDOMEN AND PELVIS FINDINGS

VASCULAR

Aorta: There is no abdominal aortic aneurysm or dissection. There
are foci atherosclerotic calcification distal abdominal aorta
without hemodynamically significant obstruction.

Celiac: No aneurysm or dissection involving the celiac artery or its
branches. There is an acute turn near the origin of the celiac
artery with less than 50% diameter narrowing in this area. Other
portions of the celiac artery as well as the celiac artery branches
otherwise widely patent throughout their courses.

SMA: No aneurysm or dissection involving the superior mesenteric
artery or its branches. Celiac artery and its branches are widely
patent.

Renals: There is a single renal artery on each side. The renal
arteries and their respective branches are widely patent. There is
minimal calcification at origin of the left main renal artery. No
aneurysm or dissection involving these vessels. No evident
fibromuscular dysplasia on either side.

IMA: Inferior mesenteric artery and its branches are widely patent.
No aneurysm or dissection involving these vessels.

Inflow: There is mild calcification in the proximal and mid common
iliac arteries without hemodynamically significant obstruction.
There is also mild calcification in distal aspect of the common
iliac arteries without hemodynamically significant obstruction.
Other major pelvic arterial vessels are widely patent bilaterally.
There is minimal calcification in the distal right common femoral
artery. Proximal profunda femoral and superficial femoral arteries
are patent. No aneurysm or dissection involving pelvic arterial
vessels noted.

Veins: No obvious venous abnormality within the limitations of this
arterial phase study.

Review of the MIP images confirms the above findings.

NON-VASCULAR

Hepatobiliary: There is hepatic steatosis. No focal liver lesions
are appreciable. Gallbladder is absent. There is no biliary duct
dilatation.

Pancreas: No pancreatic mass or inflammatory focus.

Spleen: No splenic lesions are evident.

Adrenals/Urinary Tract: Adrenals bilaterally appear normal. There is
scarring in the upper pole of the right kidney. There is no renal
mass on either side. There is symmetric fullness of each renal
collecting system without renal or ureteral calculus on either side.
The urinary bladder is distended without urinary bladder wall
thickening.

Stomach/Bowel: There is no appreciable bowel wall or mesenteric
thickening. No evident bowel obstruction. There are scattered
colonic diverticula on the right without diverticulitis. Terminal
ileum appears normal. There is fatty infiltration of the ileocecal
valve. There is no evident appendiceal/periappendiceal region
inflammation.

Lymphatic: There is no evident adenopathy in the abdomen or pelvis.

Reproductive: Uterus absent.  No adnexal masses are evident.

Other: There is no evident abscess or ascites in the abdomen or
pelvis.

Musculoskeletal: No fracture or dislocation. There are foci
degenerative change in the lumbar spine. There are no blastic or
lytic bone lesions. There is calcification in the soft tissues
posterior to the level of L5, likely due to myositis ossificans from
prior trauma. No intramuscular lesions are evident.

Review of the MIP images confirms the above findings.
IMPRESSION: CT angiogram chest:

1. No thoracic aortic aneurysm or dissection. There are foci great
vessel and aortic atherosclerosis. No ulceration or hemodynamically
significant obstruction in the thoracic aorta or visualized great
vessels.

2.  No evident pulmonary embolus.

3. Slight bibasilar atelectasis. No edema or airspace opacity. No
pleural effusions.

4.  No evident thoracic adenopathy.

CT angiogram abdomen; CT angiogram pelvis:

1. No aneurysm or dissection involving abdominal aorta, major
mesenteric, and major pelvic arterial vessels. No fibromuscular
dysplasia.

2. Scattered areas of hemodynamically significant obstruction, most
notably in the distal aorta as well as in portions of each common
iliac artery. No plaque ulcerations.

3. Urinary bladder is distended diffusely. No bladder wall
thickening. Fullness of each renal collecting system is likely due
to stasis from the urinary bladder fullness. No renal or ureteral
calculus evident on either side.

4. Right-sided colonic diverticula without diverticulitis. No bowel
wall thickening or bowel obstruction. No abscess in the abdomen or
pelvis. No periappendiceal region inflammation.

5.  Hepatic steatosis.  Gallbladder absent.

Aortic Atherosclerosis ([id number]-[id number].L).
